# Patient Record
Sex: Female | Born: 2000 | Race: Black or African American | Hispanic: No | Marital: Single | Smoking: Never smoker
Health system: Southern US, Community
[De-identification: ages and names within clinical notes are randomized; demographics above are authoritative.]

## PROBLEM LIST (undated history)

## (undated) ENCOUNTER — Inpatient Hospital Stay: Payer: Self-pay

## (undated) DIAGNOSIS — Z8759 Personal history of other complications of pregnancy, childbirth and the puerperium: Secondary | ICD-10-CM

## (undated) DIAGNOSIS — D649 Anemia, unspecified: Secondary | ICD-10-CM

## (undated) DIAGNOSIS — Z8619 Personal history of other infectious and parasitic diseases: Secondary | ICD-10-CM

## (undated) DIAGNOSIS — Z87448 Personal history of other diseases of urinary system: Secondary | ICD-10-CM

## (undated) DIAGNOSIS — D219 Benign neoplasm of connective and other soft tissue, unspecified: Secondary | ICD-10-CM

## (undated) DIAGNOSIS — N189 Chronic kidney disease, unspecified: Secondary | ICD-10-CM

## (undated) HISTORY — DX: Benign neoplasm of connective and other soft tissue, unspecified: D21.9

## (undated) HISTORY — DX: Personal history of other diseases of urinary system: Z87.448

## (undated) HISTORY — PX: OTHER SURGICAL HISTORY: SHX169

## (undated) HISTORY — DX: Personal history of other infectious and parasitic diseases: Z86.19

## (undated) HISTORY — DX: Personal history of other complications of pregnancy, childbirth and the puerperium: Z87.59

## (undated) HISTORY — DX: Chronic kidney disease, unspecified: N18.9

---
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
## 2020-01-19 ENCOUNTER — Other Ambulatory Visit: Payer: Self-pay

## 2020-01-19 ENCOUNTER — Ambulatory Visit (LOCAL_COMMUNITY_HEALTH_CENTER): Payer: Self-pay

## 2020-01-19 VITALS — BP 112/69 | Ht 62.0 in | Wt 100.5 lb

## 2020-01-19 DIAGNOSIS — Z3201 Encounter for pregnancy test, result positive: Secondary | ICD-10-CM

## 2020-01-19 LAB — PREGNANCY, URINE: Preg Test, Ur: POSITIVE — AB

## 2020-01-19 MED ORDER — PRENATAL 27-0.8 MG PO TABS: 1.0000 | ORAL_TABLET | Freq: Every day | ORAL | 0 refills | Status: AC

## 2020-01-19 NOTE — Progress Notes (Signed)
UPT positive. Plans prenatal care at [facility]. LMP 09/27/2019. States she has delayed care til now because she thought mother would be upset. Reports today that her mother is supportive. Advised pt to establish prenatal care ASAP. Pt in agreement. Questions answered and reports understanding. To clerk for preadmit. Josie Saunders, RN

## 2020-01-26 ENCOUNTER — Emergency Department
Admission: EM | Admit: 2020-01-26 | Discharge: 2020-01-26 | Disposition: A | Discharge status: Home / Self Care | Payer: Self-pay | Attending: Emergency Medicine | Admitting: Emergency Medicine

## 2020-01-26 ENCOUNTER — Other Ambulatory Visit: Payer: Self-pay

## 2020-01-26 ENCOUNTER — Encounter: Payer: Self-pay | Admitting: Emergency Medicine

## 2020-01-26 DIAGNOSIS — R1030 Lower abdominal pain, unspecified: Secondary | ICD-10-CM | POA: Insufficient documentation

## 2020-01-26 DIAGNOSIS — Z5321 Procedure and treatment not carried out due to patient leaving prior to being seen by health care provider: Secondary | ICD-10-CM | POA: Insufficient documentation

## 2020-01-26 NOTE — ED Notes (Signed)
Pt called to revitalize with no response

## 2020-01-26 NOTE — ED Notes (Signed)
Called for vital reassessment x 3 with no response

## 2020-01-26 NOTE — ED Notes (Signed)
Pt called to be revitalized with no response

## 2020-01-26 NOTE — ED Triage Notes (Signed)
C/O lower abdominal pain since 1800.  Denies vaginal bleeding.  Denies dysuria.  G2 P1.   EDC: 07/03/2020.  Has not had prenatal care.  AAOx3.  Skin warm and dry no apparent distress

## 2020-01-31 NOTE — L&D Delivery Note (Signed)
Delivery Note  First Stage: Labor onset: 0800 Augmentation: cytotec x 2 Analgesia /Anesthesia intrapartum: epidural SROM at 0950  Second Stage: Complete dilation at 1053 Onset of pushing at 1056 FHR second stage Cat II- early and variables  Delivery of a viable female infant on 06/14/20 at 1110 by CNM delivery of fetal head in OA position with restitution to LOA. No nuchal cord;  Anterior then posterior shoulders delivered easily with gentle downward traction. Baby placed on mom's chest, and attended to by peds.  Cord double clamped after cessation of pulsation, cut by FOB  Third Stage: Placenta delivered spontaneously intact with 3VC @ 1115 Placenta disposition: to pathology, Velamentous insertion noted, approx 5cm from placenta.  Uterine tone Firm / bleeding scant  No cervical, vaginal or perineal lacerations identified  Repair n/a Est. Blood Loss (mL): 287  Complications: none  Mom to postpartum.  Baby to Couplet care / Skin to Skin.  Newborn: Birth Weight: 5#14  Apgar Scores: 8/10 Feeding planned: breast

## 2020-02-04 ENCOUNTER — Observation Stay
Admission: EM | Admit: 2020-02-04 | Discharge: 2020-02-06 | Disposition: A | Discharge status: Home / Self Care | Payer: HRSA Program | Attending: Family Medicine | Admitting: Family Medicine

## 2020-02-04 ENCOUNTER — Other Ambulatory Visit: Payer: Self-pay

## 2020-02-04 ENCOUNTER — Emergency Department: Payer: HRSA Program

## 2020-02-04 DIAGNOSIS — N1330 Unspecified hydronephrosis: Secondary | ICD-10-CM | POA: Insufficient documentation

## 2020-02-04 DIAGNOSIS — O98512 Other viral diseases complicating pregnancy, second trimester: Secondary | ICD-10-CM | POA: Diagnosis present

## 2020-02-04 DIAGNOSIS — N12 Tubulo-interstitial nephritis, not specified as acute or chronic: Secondary | ICD-10-CM

## 2020-02-04 DIAGNOSIS — O26832 Pregnancy related renal disease, second trimester: Secondary | ICD-10-CM | POA: Diagnosis not present

## 2020-02-04 DIAGNOSIS — Z3492 Encounter for supervision of normal pregnancy, unspecified, second trimester: Secondary | ICD-10-CM

## 2020-02-04 DIAGNOSIS — Z3490 Encounter for supervision of normal pregnancy, unspecified, unspecified trimester: Secondary | ICD-10-CM

## 2020-02-04 DIAGNOSIS — O2302 Infections of kidney in pregnancy, second trimester: Secondary | ICD-10-CM | POA: Diagnosis not present

## 2020-02-04 DIAGNOSIS — Z3689 Encounter for other specified antenatal screening: Secondary | ICD-10-CM

## 2020-02-04 DIAGNOSIS — Z3A18 18 weeks gestation of pregnancy: Secondary | ICD-10-CM | POA: Insufficient documentation

## 2020-02-04 DIAGNOSIS — U071 COVID-19: Secondary | ICD-10-CM | POA: Insufficient documentation

## 2020-02-04 DIAGNOSIS — N10 Acute tubulo-interstitial nephritis: Secondary | ICD-10-CM

## 2020-02-04 DIAGNOSIS — R1031 Right lower quadrant pain: Secondary | ICD-10-CM

## 2020-02-04 LAB — URINALYSIS, COMPLETE (UACMP) WITH MICROSCOPIC
Bilirubin Urine: NEGATIVE
Glucose, UA: NEGATIVE mg/dL
Ketones, ur: 80 mg/dL — AB
Nitrite: POSITIVE — AB
Protein, ur: 30 mg/dL — AB
Specific Gravity, Urine: 1.015 (ref 1.005–1.030)
WBC, UA: >50 WBC/hpf — ABNORMAL HIGH (ref 0–5)
pH: 5 (ref 5.0–8.0)

## 2020-02-04 LAB — COMPREHENSIVE METABOLIC PANEL
ALT: 9 U/L (ref 0–44)
AST: 13 U/L — ABNORMAL LOW (ref 15–41)
Albumin: 3.1 g/dL — ABNORMAL LOW (ref 3.5–5.0)
Alkaline Phosphatase: 47 U/L (ref 38–126)
Anion gap: 10 (ref 5–15)
BUN: 9 mg/dL (ref 6–20)
CO2: 27 mmol/L (ref 22–32)
Calcium: 8.7 mg/dL — ABNORMAL LOW (ref 8.9–10.3)
Chloride: 99 mmol/L (ref 98–111)
Creatinine, Ser: 0.58 mg/dL (ref 0.44–1.00)
GFR, Estimated: >60 mL/min (ref >60)
Glucose, Bld: 88 mg/dL (ref 70–99)
Potassium: 3.1 mmol/L — ABNORMAL LOW (ref 3.5–5.1)
Sodium: 136 mmol/L (ref 135–145)
Total Bilirubin: 0.6 mg/dL (ref 0.3–1.2)
Total Protein: 7.1 g/dL (ref 6.5–8.1)

## 2020-02-04 LAB — CBC
HCT: 34.3 % — ABNORMAL LOW (ref 36.0–46.0)
Hemoglobin: 11.6 g/dL — ABNORMAL LOW (ref 12.0–15.0)
MCH: 29.4 pg (ref 26.0–34.0)
MCHC: 33.8 g/dL (ref 30.0–36.0)
MCV: 87.1 fL (ref 80.0–100.0)
Platelets: 213 10*3/uL (ref 150–400)
RBC: 3.94 MIL/uL (ref 3.87–5.11)
RDW: 12.5 % (ref 11.5–15.5)
WBC: 9.2 10*3/uL (ref 4.0–10.5)
nRBC: 0 % (ref 0.0–0.2)

## 2020-02-04 LAB — LIPASE, BLOOD: Lipase: 20 U/L (ref 11–51)

## 2020-02-04 LAB — HCG, QUANTITATIVE, PREGNANCY: hCG, Beta Chain, Quant, S: 32835 m[IU]/mL — ABNORMAL HIGH (ref <5)

## 2020-02-04 MED ORDER — SODIUM CHLORIDE 0.9 % IV SOLN
1.0000 g | Freq: Once | INTRAVENOUS | Status: AC
  Administered 2020-02-05: 1 g via INTRAVENOUS
  Filled 2020-02-04: qty 10

## 2020-02-04 MED ORDER — SODIUM CHLORIDE 0.9 % IV BOLUS (SEPSIS)
1000.0000 mL | Freq: Once | INTRAVENOUS | Status: AC
  Administered 2020-02-05: 1000 mL via INTRAVENOUS

## 2020-02-04 NOTE — ED Notes (Signed)
Patient transported to Ultrasound 

## 2020-02-04 NOTE — ED Triage Notes (Signed)
Pt comes pov [redacted] weeks pregnant with abdominal pain for about a week

## 2020-02-04 NOTE — ED Provider Notes (Signed)
11:00 pm  Assumed care.  20 year old female here with right lower quadrant pain for several days.  Approximate 18 weeks by dates.  OB ultrasound pending.  May need MRI of the abdomen to rule out appendicitis.  Urine does appear infected.  She is nitrite positive with large leuks, rare bacteria.  White blood cell clumps present.  Culture pending.  Will give IV Rocephin.  She also has large ketones in her urine.  Will start give IV hydration.  2:00 AM  Pt's OB ultrasound shows single intrauterine pregnancy of 18 weeks 3 days without acute abnormality.  Renal ultrasound added on given patient's urinalysis and shows moderate right and mild left hydronephrosis with no visible stone.  She has no known history of kidney stones and appears well-appearing at this time.  Complains mostly of having right flank and right lower quadrant abdominal pain.  I suspect that pyelonephritis is the cause of her symptoms today.  Given she is pregnant, will discuss with medicine for admission.  She reports her previous pregnancy she was seen by the health department.  She is scheduled to see [facility] clinic in January but has not seen anyone yet for this pregnancy.  I do not feel this time that OB/GYN needs to be involved emergently given this is not technically a viable pregnancy.  2:48 AM Discussed patient's case with hospitalist, Dr. Para March.  I have recommended admission and patient (and family if present) agree with this plan. Admitting physician will place admission orders.   I reviewed all nursing notes, vitals, pertinent previous records and reviewed/interpreted all EKGs, lab and urine results, imaging (as available).     Sullivan Blasing, Layla Maw, DO 02/05/20 [phone]

## 2020-02-04 NOTE — ED Provider Notes (Signed)
[facility] Emergency Department Provider Note  ____________________________________________   I have reviewed the triage vital signs and the nursing notes.   HISTORY  Chief Complaint Abdominal Pain   History limited by: Not Limited   HPI Betty Allen is a 20 y.o. female who presents to the emergency department today because of concerns for right lower abdominal pain.  Patient thinks that she is roughly [redacted] weeks pregnant.  She states he just recently found out and has not yet had an ultrasound.  She states that the pain started roughly 1 week ago.  Initially was somewhat intermittent however it is more constant now.  She does describe it as severe.  She denies any unusual vaginal discharge or vaginal bleeding.  She denies any problems with urination or defecation.   Records reviewed. Per medical record review patient has a history of fibroids.   Past Medical History:  Diagnosis Date  . Anemia   . History of chlamydia   . History of gonorrhea   . History of prior pregnancy with IUGR newborn   . History of spontaneous abortion    x1    Patient Active Problem List   Diagnosis Date Noted  . 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 Preterm uterine contractions, antepartum, third trimester 10/30/2017  . resolved Fetal growth retardation, antepartum 10/08/2017    Past Surgical History:  Procedure Laterality Date  . denies surgical history      Prior to Admission medications   Medication Sig Start Date End Date Taking? Authorizing Provider  ferrous sulfate 325 (65 FE) MG tablet Take 1 tablet (325 mg total) by mouth 2 (two) times daily with a meal. Patient not taking: Reported on 01/08/2019 12/22/17   Sharee Pimple, CNM  ibuprofen (ADVIL) 800 MG tablet Take 1 tablet (800 mg total) by mouth every 8 (eight) hours as needed. Patient not taking: Reported on 01/08/2019 06/25/18   Emily Filbert, MD  Prenatal Vit-Fe Fumarate-FA (MULTIVITAMIN-PRENATAL)  27-0.8 MG TABS tablet Take 1 tablet by mouth daily at 12 noon. 01/19/20 04/28/20  Federico Flake, MD  Prenatal Vit-Fe Fumarate-FA (PRENATAL MULTIVITAMIN) TABS tablet Take 1 tablet by mouth daily at 12 noon.    [provider]  Prenatal Vit-Fe Fumarate-FA (PRENATAL VITAMIN) 27-0.8 MG TABS Take 1 tablet by mouth daily at 6 (six) AM. Patient not taking: Reported on 01/19/2020 01/08/19   Federico Flake, MD  sulfamethoxazole-trimethoprim (BACTRIM DS) 800-160 MG tablet Take 1 tablet by mouth 2 (two) times daily. Patient not taking: Reported on 01/08/2019 06/25/18   Emily Filbert, MD    Allergies Penicillins  Family History  Problem Relation Age of Onset  . Hypertension Father   . Hypertension Maternal Grandmother   . Hypertension Maternal Grandfather   . Hypertension Paternal Grandmother   . Hypertension Paternal Grandfather     Social History Social History   Tobacco Use  . Smoking status: Never Smoker  . Smokeless tobacco: Never Used  Vaping Use  . Vaping Use: Never used  Substance Use Topics  . Alcohol use: No  . Drug use: Not Currently    Types: Marijuana    Comment: Last use in summer 2020    Review of Systems Constitutional: No fever/chills Eyes: No visual changes. ENT: No sore throat. Cardiovascular: Denies chest pain. Respiratory: Denies shortness of breath. Gastrointestinal: Positive for lower abdominal pain. Genitourinary: Negative for dysuria. Musculoskeletal: Negative for back pain. Skin: Negative for rash. Neurological: Negative for headaches, focal weakness or numbness.  ____________________________________________   PHYSICAL EXAM:  VITAL SIGNS: ED Triage Vitals  Enc Vitals Group     BP 02/04/20 1803 107/72     Pulse Rate 02/04/20 1803 88     Resp 02/04/20 1803 18     Temp 02/04/20 1803 98.1 F (36.7 C)     Temp Source 02/04/20 1803 Oral     SpO2 02/04/20 1803 99 %     Weight 02/04/20 1804 105 lb (47.6 kg)     Height  02/04/20 1804 5\' 2"  (1.575 m)     Head Circumference --      Peak Flow --      Pain Score 02/04/20 1803 10   Constitutional: Alert and oriented.  Eyes: Conjunctivae are normal.  ENT      Head: Normocephalic and atraumatic.      Nose: No congestion/rhinnorhea.      Mouth/Throat: Mucous membranes are moist.      Neck: No stridor. Hematological/Lymphatic/Immunilogical: No cervical lymphadenopathy. Cardiovascular: Normal rate, regular rhythm.  No murmurs, rubs, or gallops.  Respiratory: Normal respiratory effort without tachypnea nor retractions. Breath sounds are clear and equal bilaterally. No wheezes/rales/rhonchi. Gastrointestinal: Soft, gravid. Tender to palpation in the right lower quadrant. Genitourinary: Deferred Musculoskeletal: Normal range of motion in all extremities. No lower extremity edema. Neurologic:  Normal speech and language. No gross focal neurologic deficits are appreciated.  Skin:  Skin is warm, dry and intact. No rash noted. Psychiatric: Mood and affect are normal. Speech and behavior are normal. Patient exhibits appropriate insight and judgment.  ____________________________________________    LABS (pertinent positives/negatives)  hcg 32,835 Lipase 20 CBC wbc 9.2, hgb 11.6, plt 213 CMP na 136, k 3.1, cr 0.58  ____________________________________________   EKG  None  ____________________________________________    RADIOLOGY  None   ____________________________________________   PROCEDURES  Procedures  ____________________________________________   INITIAL IMPRESSION / ASSESSMENT AND PLAN / ED COURSE  Pertinent labs & imaging results that were available during my care of the patient were reviewed by me and considered in my medical decision making (see chart for details).   Patient presents to the emergency department today because of concerns for right lower quadrant pain in the setting of early first trimester pregnancy.  The patient  has not had an ultrasound done at this time so exact dates are unknown.  She thinks she is roughly [redacted] weeks pregnant by last menstrual period.  On exam patient does have a slightly gravid stomach with tenderness to the right lower quadrant.  I do think first reasonable step would be obtaining an ultrasound.  If ultrasound is unrevealing would potentially consider MRI for appendicitis if patient continues to have pain.  ____________________________________________   FINAL CLINICAL IMPRESSION(S) / ED DIAGNOSES  Final diagnoses:  Right lower quadrant abdominal pain  Pregnancy, unspecified gestational age     Note: This dictation was prepared with Diplomatic Services operational officer dictation. Any transcriptional errors that result from this process are unintentional     Nance Pear, MD 02/04/20 2315

## 2020-02-05 ENCOUNTER — Observation Stay: Payer: HRSA Program

## 2020-02-05 ENCOUNTER — Emergency Department: Payer: HRSA Program

## 2020-02-05 DIAGNOSIS — N1330 Unspecified hydronephrosis: Secondary | ICD-10-CM

## 2020-02-05 DIAGNOSIS — O26832 Pregnancy related renal disease, second trimester: Secondary | ICD-10-CM | POA: Diagnosis not present

## 2020-02-05 DIAGNOSIS — Z3A18 18 weeks gestation of pregnancy: Secondary | ICD-10-CM | POA: Diagnosis not present

## 2020-02-05 DIAGNOSIS — O2302 Infections of kidney in pregnancy, second trimester: Secondary | ICD-10-CM | POA: Diagnosis not present

## 2020-02-05 DIAGNOSIS — N10 Acute tubulo-interstitial nephritis: Secondary | ICD-10-CM | POA: Diagnosis not present

## 2020-02-05 DIAGNOSIS — Z3492 Encounter for supervision of normal pregnancy, unspecified, second trimester: Secondary | ICD-10-CM

## 2020-02-05 LAB — SARS CORONAVIRUS 2 (TAT 6-24 HRS): SARS Coronavirus 2: POSITIVE — AB

## 2020-02-05 LAB — MAGNESIUM: Magnesium: 1.9 mg/dL (ref 1.7–2.4)

## 2020-02-05 LAB — HIV ANTIBODY (ROUTINE TESTING W REFLEX): HIV Screen 4th Generation wRfx: NONREACTIVE

## 2020-02-05 LAB — POC SARS CORONAVIRUS 2 AG -  ED: SARS Coronavirus 2 Ag: POSITIVE — AB

## 2020-02-05 MED ORDER — SODIUM CHLORIDE 0.9 % IV SOLN
INTRAVENOUS | Status: AC
  Filled 2020-02-05: qty 1

## 2020-02-05 MED ORDER — ONDANSETRON HCL 4 MG PO TABS: 4.0000 mg | ORAL_TABLET | Freq: Four times a day (QID) | ORAL | Status: DC | PRN

## 2020-02-05 MED ORDER — ACETAMINOPHEN 500 MG PO TABS
1000.0000 mg | ORAL_TABLET | Freq: Once | ORAL | Status: AC
  Administered 2020-02-05: 1000 mg via ORAL
  Filled 2020-02-05: qty 2

## 2020-02-05 MED ORDER — ONDANSETRON HCL 4 MG/2ML IJ SOLN: 4.0000 mg | Freq: Four times a day (QID) | INTRAMUSCULAR | Status: DC | PRN

## 2020-02-05 MED ORDER — POTASSIUM CHLORIDE CRYS ER 20 MEQ PO TBCR
40.0000 meq | EXTENDED_RELEASE_TABLET | Freq: Once | ORAL | Status: AC
  Administered 2020-02-05: 40 meq via ORAL
  Filled 2020-02-05: qty 2

## 2020-02-05 MED ORDER — SODIUM CHLORIDE 0.9 % IV SOLN: INTRAVENOUS | Status: DC

## 2020-02-05 MED ORDER — SODIUM CHLORIDE 0.9 % IV SOLN
1.0000 g | INTRAVENOUS | Status: DC
  Administered 2020-02-06: 1 g via INTRAVENOUS
  Filled 2020-02-05 (×2): qty 10

## 2020-02-05 MED ORDER — PRENATAL MULTIVITAMIN CH
1.0000 | ORAL_TABLET | Freq: Every day | ORAL | Status: DC
  Administered 2020-02-05 – 2020-02-06 (×2): 1 via ORAL
  Filled 2020-02-05 (×3): qty 1

## 2020-02-05 MED ORDER — ACETAMINOPHEN 325 MG PO TABS
650.0000 mg | ORAL_TABLET | Freq: Four times a day (QID) | ORAL | Status: DC | PRN
  Administered 2020-02-06: 650 mg via ORAL
  Filled 2020-02-05: qty 2

## 2020-02-05 MED ORDER — FERROUS SULFATE 325 (65 FE) MG PO TABS: 325.0000 mg | ORAL_TABLET | Freq: Two times a day (BID) | ORAL | Status: DC

## 2020-02-05 MED ORDER — ACETAMINOPHEN 650 MG RE SUPP: 650.0000 mg | Freq: Four times a day (QID) | RECTAL | Status: DC | PRN

## 2020-02-05 MED ORDER — HYDROCODONE-ACETAMINOPHEN 5-325 MG PO TABS
1.0000 | ORAL_TABLET | ORAL | Status: DC | PRN
  Administered 2020-02-05: 2 via ORAL
  Filled 2020-02-05: qty 2

## 2020-02-05 NOTE — Consult Note (Signed)
Consult History and Physical   SERVICE: Obstetrics   Patient Name: Betty Allen Patient MRN:   [id number]  CC: abdominal and flank pain   HPI: Betty Allen is a 20 y.o. [phone] with no established prenatal care who presented to the emergency room with right flank pain.  She states that the pain has been getting worse over the past couple of weeks but became more constant and severe yesterday.  She denies vaginal bleeding, LOF, or cramping.  She denies cough, SOB, or chest pain.    Review of Systems: positives in bold Review of Systems  Constitutional: Positive for chills and malaise/fatigue.  Respiratory: Negative for cough and shortness of breath.   Cardiovascular: Negative for chest pain.  Gastrointestinal: Positive for abdominal pain. Negative for nausea.  Genitourinary: Positive for dysuria and flank pain.  Neurological: Negative for dizziness.  Psychiatric/Behavioral: The patient is not nervous/anxious.     Past Obstetrical History: OB History    Gravida  4   Para  1   Term  1   Preterm  0   AB  2   Living  1     SAB  2   IAB  0   Ectopic  0   Multiple  0   Live Births  1           Past Medical History: Past Medical History:  Diagnosis Date  . Anemia   . History of chlamydia   . History of gonorrhea   . History of prior pregnancy with IUGR newborn   . History of spontaneous abortion    x1    Past Surgical History:   Past Surgical History:  Procedure Laterality Date  . denies surgical history      Family History:  family history includes Hypertension in her father, maternal grandfather, maternal grandmother, paternal grandfather, and paternal grandmother.  Social History:   reports that she has never smoked. She has never used smokeless tobacco. She reports previous drug use. Drug: Marijuana. She reports that she does not drink alcohol.   Home Medications:  Medications reconciled in EPIC  No current  facility-administered medications on file prior to encounter.   Current Outpatient Medications on File Prior to Encounter  Medication Sig Dispense Refill  . Prenatal Vit-Fe Fumarate-FA (MULTIVITAMIN-PRENATAL) 27-0.8 MG TABS tablet Take 1 tablet by mouth daily at 12 noon. 100 tablet 0  . ferrous sulfate 325 (65 FE) MG tablet Take 1 tablet (325 mg total) by mouth 2 (two) times daily with a meal. (Patient not taking: No sig reported) 90 tablet 1  . ibuprofen (ADVIL) 800 MG tablet Take 1 tablet (800 mg total) by mouth every 8 (eight) hours as needed. (Patient not taking: No sig reported) 30 tablet 0  . Prenatal Vit-Fe Fumarate-FA (PRENATAL MULTIVITAMIN) TABS tablet Take 1 tablet by mouth daily at 12 noon. (Patient not taking: Reported on 02/05/2020)    . Prenatal Vit-Fe Fumarate-FA (PRENATAL VITAMIN) 27-0.8 MG TABS Take 1 tablet by mouth daily at 6 (six) AM. (Patient not taking: No sig reported) 100 tablet 0  . sulfamethoxazole-trimethoprim (BACTRIM DS) 800-160 MG tablet Take 1 tablet by mouth 2 (two) times daily. (Patient not taking: No sig reported) 6 tablet 0    Allergies:  Allergies  Allergen Reactions  . Penicillins Rash    Has patient had a PCN reaction causing immediate rash, facial/tongue/throat swelling, SOB or lightheadedness with hypotension: Yes Has patient had a PCN reaction causing severe rash involving mucus membranes  or skin necrosis: No Has patient had a PCN reaction that required hospitalization No Has patient had a PCN reaction occurring within the last 10 years: No If all of the above answers are "NO", then may proceed with Cephalosporin use.     Physical Exam:  Temp:  [98.1 F (36.7 C)] 98.1 F (36.7 C) (01/05 1803) Pulse Rate:  [80-108] 80 (01/06 0730) Resp:  [16-18] 16 (01/06 0730) BP: (94-108)/(57-72) 94/57 (01/06 0730) SpO2:  [99 %-100 %] 99 % (01/06 0730) Weight:  [47.6 kg] 47.6 kg (01/05 1804)   General Appearance:  Well developed, well nourished, no acute  distress, alert and oriented, cooperative and appears stated age HEENT:  Normocephalic atraumatic, extraocular movements intact, moist mucous membranes, neck supple with midline trachea and thyroid without masses Cardiovascular:  Normal S1/S2, regular rate and rhythm, no murmurs, 2+ distal pulses Pulmonary:  clear to auscultation, no wheezes, rales or rhonchi, symmetric air entry, good air exchange Abdomen:  Gravid, bowel sounds present, soft, nontender, nondistended, no abnormal masses or organomegaly, no epigastric pain, + CVA tenderness on right side Back: inspection of back is normal Extremities:  extremities normal, no tenderness, atraumatic, no cyanosis or edema Skin:  Warm to touch, normal coloration and turgor, no rashes, no suspicious skin lesions noted  Neurologic:  Cranial nerves 2-12 grossly intact, grossly equal strength and muscle tone, normal speech, no focal findings or movement disorder noted. Psychiatric:  Normal mood and affect, appropriate, no AH/VH Pelvic:  Deferred  Uterus: enlarged, c/w [redacted] week gestation   Labs/Studies:   CBC and Coags:  Lab Results  Component Value Date   WBC 9.2 02/04/2020   NEUTOPHILPCT 70 11/16/2015   EOSPCT 2 11/16/2015   BASOPCT 0 11/16/2015   LYMPHOPCT 21 11/16/2015   HGB 11.6 (L) 02/04/2020   HCT 34.3 (L) 02/04/2020   MCV 87.1 02/04/2020   PLT 213 02/04/2020   CMP:  Lab Results  Component Value Date   NA 136 02/04/2020   K 3.1 (L) 02/04/2020   CL 99 02/04/2020   CO2 27 02/04/2020   BUN 9 02/04/2020   CREATININE 0.58 02/04/2020   CREATININE 0.55 02/10/2019   CREATININE 0.68 06/25/2018   PROT 7.1 02/04/2020   BILITOT 0.6 02/04/2020   ALT 9 02/04/2020   AST 13 (L) 02/04/2020   ALKPHOS 47 02/04/2020   Other Imaging: US OB Limited  Result Date: 02/05/2020 CLINICAL DATA:  Right lower quadrant abdominal pain. Pregnant, LMP 09/27/2019 EXAM: LIMITED OBSTETRIC ULTRASOUND FINDINGS: Number of Fetuses: 1 Heart Rate:  138 bpm Movement:  Yes Presentation: Cephalic Placental Location: Anterior Previa: No Amniotic Fluid (Subjective):  Within normal limits. AFI:   Not calculated Femur length: 2.7 cm 18 w  3 d MATERNAL FINDINGS: Cervix:  Appears closed. Uterus/Adnexae: The maternal right ovary is unremarkable. No free fluid within the cul-de-sac. The maternal left ovary is not visualized. No adnexal masses. IMPRESSION: Single living intrauterine gestation with an estimated gestational age of [redacted] weeks, 3 days. No acute abnormality. This exam is performed on an emergent basis and does not comprehensively evaluate fetal size, dating, or anatomy; follow-up complete OB US should be considered if further fetal assessment is warranted. Electronically Signed   By: Helyn Numbers MD   On: 02/05/2020 01:05   US Renal  Result Date: 02/05/2020 CLINICAL DATA:  Initial evaluation for acute right flank pain. UTI, pregnancy. EXAM: RENAL / URINARY TRACT ULTRASOUND COMPLETE COMPARISON:  Prior CT from 06/25/2018. FINDINGS: Right Kidney: Renal measurements: To 13.2  x 6.2 x 5.4 cm = volume: 227.8 mL. Increased echogenicity within the renal parenchyma. No visible nephrolithiasis. Moderate hydronephrosis and proximal hydroureter. No focal renal mass. Left Kidney: Renal measurements: 11.9 x 4.6 x 4.2 cm = volume: 119.5 mL. Increased echogenicity within the renal parenchyma. No visible nephrolithiasis. Mild hydronephrosis. No focal renal mass. Bladder: Appears normal for degree of bladder distention. Echogenic material within the bladder could reflect debris and/or sedimentation. Ureteral jets not visualized. Other: Gravid uterus partially visualized. IMPRESSION: 1. Moderate right and mild left hydronephrosis. No visible nephrolithiasis by sonography. 2. Increased echogenicity within the renal parenchyma, suggesting medical renal disease. 3. Echogenic material within the bladder, which could reflect debris and/or sedimentation. Correlation with urinalysis recommended.  Electronically Signed   By: Jeannine Boga M.D.   On: 02/05/2020 01:55     Assessment / Plan:   PORTER PLINER is a 20 y.o. [id number] who presents with UTI, suspect acute pyelonephritis   1.  Mekhia has not yet established care for this pregnancy but has an appointment later this month.  Exam today is consistent with ~[redacted] week gestation.   2. IV antibiotics have been initiated for likely acute pyelonephritis.  She will need at least 24-48 hours of IV antibiotics until symptomatically improving and fever free for 24 hours.   3. Plan for FHT q shift  4. Incidental covid + on admission, asymptomatic.  She received both doses of Moderna Covid vaccine, with her 2nd dose being last month.     Thank you for the opportunity to be involved with this patient's care.  ----- Drinda Butts, CNM Midwife [facility], Department of [facility]

## 2020-02-05 NOTE — H&P (Signed)
History and Physical    Betty Allen [id number] DOB: [birth year] DOA: 02/04/2020  PCP: Patient, No Pcp Per   Patient coming from: Home  I have personally briefly reviewed patient's old medical records in [place]  Chief Complaint: Right flank pain  HPI: Betty Allen is a 20 y.o. female who is [redacted] weeks pregnant with no prenatal care until yesterday who presented to the emergency room with right flank pain, intermittent over the past week but now more constant.  Pain is moderate to severe, colicky, nonradiating, no aggravating or alleviating factors.  She denies vaginal bleeding or discharge or leaking.  She has no associated nausea, vomiting, diarrhea, dysuria, fever or chills.  She denies cough or shortness of breath and denies chest pain  ED Course: On arrival in the emergency room, vitals were within normal limits.  Blood work normal except for mild hypokalemia of 3.1.  Lipase normal at 28.  LFTs normal.  Urinalysis with pyuria, ketones and protein. Imaging: OB ultrasound shows single viable fetus with heart rate 138.  Renal ultrasound showed moderate right and mild left hydronephrosis but no visible nephrolithiasis.  Increased echogenicity of the renal parenchyma suggesting medical renal disease.  Echogenic material within the bladder.  Patient started on Rocephin.  Hospitalist consulted for admission.  Review of Systems: As per HPI otherwise all other systems on review of systems negative.    Past Medical History:  Diagnosis Date  . Anemia   . History of chlamydia   . History of gonorrhea   . History of prior pregnancy with IUGR newborn   . History of spontaneous abortion    x1    Past Surgical History:  Procedure Laterality Date  . denies surgical history       reports that she has never smoked. She has never used smokeless tobacco. She reports previous drug use. Drug: Marijuana. She reports that she does not drink alcohol.  Allergies  Allergen  Reactions  . Penicillins Rash    Has patient had a PCN reaction causing immediate rash, facial/tongue/throat swelling, SOB or lightheadedness with hypotension: Yes Has patient had a PCN reaction causing severe rash involving mucus membranes or skin necrosis: No Has patient had a PCN reaction that required hospitalization No Has patient had a PCN reaction occurring within the last 10 years: No If all of the above answers are "NO", then may proceed with Cephalosporin use.     Family History  Problem Relation Age of Onset  . Hypertension Father   . Hypertension Maternal Grandmother   . Hypertension Maternal Grandfather   . Hypertension Paternal Grandmother   . Hypertension Paternal Grandfather       Prior to Admission medications   Medication Sig Start Date End Date Taking? Authorizing Provider  Prenatal Vit-Fe Fumarate-FA (MULTIVITAMIN-PRENATAL) 27-0.8 MG TABS tablet Take 1 tablet by mouth daily at 12 noon. 01/19/20 04/28/20 Yes Caren Macadam, MD  ferrous sulfate 325 (65 FE) MG tablet Take 1 tablet (325 mg total) by mouth 2 (two) times daily with a meal. Patient not taking: No sig reported 12/22/17   Catheryn Bacon, CNM  ibuprofen (ADVIL) 800 MG tablet Take 1 tablet (800 mg total) by mouth every 8 (eight) hours as needed. Patient not taking: No sig reported 06/25/18   Earleen Newport, MD  Prenatal Vit-Fe Fumarate-FA (PRENATAL MULTIVITAMIN) TABS tablet Take 1 tablet by mouth daily at 12 noon. Patient not taking: Reported on 02/05/2020    [provider]  Prenatal Vit-Fe Fumarate-FA (PRENATAL VITAMIN) 27-0.8 MG TABS Take 1 tablet by mouth daily at 6 (six) AM. Patient not taking: No sig reported 01/08/19   Federico Flake, MD  sulfamethoxazole-trimethoprim (BACTRIM DS) 800-160 MG tablet Take 1 tablet by mouth 2 (two) times daily. Patient not taking: No sig reported 06/25/18   Emily Filbert, MD    Physical Exam: Vitals:   02/04/20 1803 02/04/20 1804  02/05/20 0233  BP: 107/72  108/70  Pulse: 88  (!) 108  Resp: 18  16  Temp: 98.1 F (36.7 C)    TempSrc: Oral    SpO2: 99%  100%  Weight:  47.6 kg   Height:  5\' 2"  (1.575 m)      Vitals:   02/04/20 1803 02/04/20 1804 02/05/20 0233  BP: 107/72  108/70  Pulse: 88  (!) 108  Resp: 18  16  Temp: 98.1 F (36.7 C)    TempSrc: Oral    SpO2: 99%  100%  Weight:  47.6 kg   Height:  5\' 2"  (1.575 m)       Constitutional: Alert and oriented x 3 . Not in any apparent distress HEENT:      Head: Normocephalic and atraumatic.         Eyes: PERLA, EOMI, Conjunctivae are normal. Sclera is non-icteric.       Mouth/Throat: Mucous membranes are moist.       Neck: Supple with no signs of meningismus. Cardiovascular: Regular rate and rhythm. No murmurs, gallops, or rubs. 2+ symmetrical distal pulses are present . No JVD. No LE edema Respiratory: Respiratory effort normal .Lungs sounds clear bilaterally. No wheezes, crackles, or rhonchi.  Gastrointestinal: Soft, tender right flank, and non distended with positive bowel sounds.  Genitourinary:  Right CVA tenderness. Musculoskeletal: Nontender with normal range of motion in all extremities. No cyanosis, or erythema of extremities. Neurologic:  Face is symmetric. Moving all extremities. No gross focal neurologic deficits . Skin: Skin is warm, dry.  No rash or ulcers Psychiatric: Mood and affect are normal    Labs on Admission: I have personally reviewed following labs and imaging studies  CBC: Recent Labs  Lab 02/04/20 1805  WBC 9.2  HGB 11.6*  HCT 34.3*  MCV 87.1  PLT 213   Basic Metabolic Panel: Recent Labs  Lab 02/04/20 1805  NA 136  K 3.1*  CL 99  CO2 27  GLUCOSE 88  BUN 9  CREATININE 0.58  CALCIUM 8.7*   GFR: Estimated Creatinine Clearance: 85 mL/min (by C-G formula based on SCr of 0.58 mg/dL). Liver Function Tests: Recent Labs  Lab 02/04/20 1805  AST 13*  ALT 9  ALKPHOS 47  BILITOT 0.6  PROT 7.1  ALBUMIN 3.1*    Recent Labs  Lab 02/04/20 1805  LIPASE 20   No results for input(s): AMMONIA in the last 168 hours. Coagulation Profile: No results for input(s): INR, PROTIME in the last 168 hours. Cardiac Enzymes: No results for input(s): CKTOTAL, CKMB, CKMBINDEX, TROPONINI in the last 168 hours. BNP (last 3 results) No results for input(s): PROBNP in the last 8760 hours. HbA1C: No results for input(s): HGBA1C in the last 72 hours. CBG: No results for input(s): GLUCAP in the last 168 hours. Lipid Profile: No results for input(s): CHOL, HDL, LDLCALC, TRIG, CHOLHDL, LDLDIRECT in the last 72 hours. Thyroid Function Tests: No results for input(s): TSH, T4TOTAL, FREET4, T3FREE, THYROIDAB in the last 72 hours. Anemia Panel: No results for input(s): VITAMINB12, FOLATE, FERRITIN,  TIBC, IRON, RETICCTPCT in the last 72 hours. Urine analysis:    Component Value Date/Time   COLORURINE YELLOW (A) 02/04/2020 2315   APPEARANCEUR CLOUDY (A) 02/04/2020 2315   APPEARANCEUR Clear 07/08/2013 1225   LABSPEC 1.015 02/04/2020 2315   LABSPEC 1.018 07/08/2013 1225   PHURINE 5.0 02/04/2020 2315   GLUCOSEU NEGATIVE 02/04/2020 2315   GLUCOSEU Negative 07/08/2013 1225   HGBUR SMALL (A) 02/04/2020 2315   BILIRUBINUR NEGATIVE 02/04/2020 2315   BILIRUBINUR Negative 07/08/2013 1225   KETONESUR 80 (A) 02/04/2020 2315   PROTEINUR 30 (A) 02/04/2020 2315   NITRITE POSITIVE (A) 02/04/2020 2315   LEUKOCYTESUR LARGE (A) 02/04/2020 2315   LEUKOCYTESUR Negative 07/08/2013 1225    Radiological Exams on Admission: US OB Limited  Result Date: 02/05/2020 CLINICAL DATA:  Right lower quadrant abdominal pain. Pregnant, LMP 09/27/2019 EXAM: LIMITED OBSTETRIC ULTRASOUND FINDINGS: Number of Fetuses: 1 Heart Rate:  138 bpm Movement: Yes Presentation: Cephalic Placental Location: Anterior Previa: No Amniotic Fluid (Subjective):  Within normal limits. AFI:   Not calculated Femur length: 2.7 cm 18 w  3 d MATERNAL FINDINGS: Cervix:   Appears closed. Uterus/Adnexae: The maternal right ovary is unremarkable. No free fluid within the cul-de-sac. The maternal left ovary is not visualized. No adnexal masses. IMPRESSION: Single living intrauterine gestation with an estimated gestational age of [redacted] weeks, 3 days. No acute abnormality. This exam is performed on an emergent basis and does not comprehensively evaluate fetal size, dating, or anatomy; follow-up complete OB US should be considered if further fetal assessment is warranted. Electronically Signed   By: Fidela Salisbury MD   On: 02/05/2020 01:05   US Renal  Result Date: 02/05/2020 CLINICAL DATA:  Initial evaluation for acute right flank pain. UTI, pregnancy. EXAM: RENAL / URINARY TRACT ULTRASOUND COMPLETE COMPARISON:  Prior CT from 06/25/2018. FINDINGS: Right Kidney: Renal measurements: To 13.2 x 6.2 x 5.4 cm = volume: 227.8 mL. Increased echogenicity within the renal parenchyma. No visible nephrolithiasis. Moderate hydronephrosis and proximal hydroureter. No focal renal mass. Left Kidney: Renal measurements: 11.9 x 4.6 x 4.2 cm = volume: 119.5 mL. Increased echogenicity within the renal parenchyma. No visible nephrolithiasis. Mild hydronephrosis. No focal renal mass. Bladder: Appears normal for degree of bladder distention. Echogenic material within the bladder could reflect debris and/or sedimentation. Ureteral jets not visualized. Other: Gravid uterus partially visualized. IMPRESSION: 1. Moderate right and mild left hydronephrosis. No visible nephrolithiasis by sonography. 2. Increased echogenicity within the renal parenchyma, suggesting medical renal disease. 3. Echogenic material within the bladder, which could reflect debris and/or sedimentation. Correlation with urinalysis recommended. Electronically Signed   By: Jeannine Boga M.D.   On: 02/05/2020 01:55     Assessment/Plan 20 year old female, [redacted] weeks pregnant with no prenatal care until 1/5 who presented with right flank  pain  UTI, suspect acute pyelonephritis Bilateral hydronephrosis without nephrolithiasis - Patient with right flank pain and work-up consistent with UTI - Bilateral hydronephrosis with increased echogenicity of kidneys suggesting medical renal disease - Patient received a dose of Rocephin in the emergency room - Severe allergy to penicillin listed.  Will defer further antibiotic choice pending OB consult.  Rocephin dose was at 2345 on 1/5 - Rocephin can be resumed if patient has no adverse reaction to dose in the emergency room - Consider urology consult in the a.m. for hydronephrosis    Second trimester pregnancy - Consider OB consult in the a.m.    DVT prophylaxis: SCDs Code Status: full code  Family Communication:  none  Disposition Plan: Back to previous home environment Consults called: none  Status: Observation    Athena Masse MD [facility]     02/05/2020, 2:58 AM

## 2020-02-05 NOTE — ED Notes (Signed)
Pt requesting tylenol for pain, MD made aware.

## 2020-02-05 NOTE — ED Notes (Signed)
Pt returned from US

## 2020-02-05 NOTE — ED Notes (Signed)
Pt resting in bed with visitor at bedside. Lights dimmed and call bell in reach. NAD noted at this time.

## 2020-02-05 NOTE — Consult Note (Signed)
Urology Consult  I have been asked to see the patient by Dr. Maryfrances Bunnell, for evaluation and management of bilateral hydronephrosis, UTI, and right flank pain.  Chief Complaint: Right flank pain  History of Present Illness: Betty Allen is a 20 y.o. year old pregnant female at [redacted]w[redacted]d by imaging who presented to the ED yesterday with reports of right flank pain.  Admission labs notable for Covid positive; creatinine 0.58, consistent with baseline; WBC count 9.2; and UA with nitrites, 6-10 RBCs/hpf, >50 WBCs/hpf, 6-10 squamous epithelial cells/LPF, and WBC clumps.  Urine culture pending, on antibiotics as below.  She underwent renal ultrasound today with findings of moderate right and mild left hydronephrosis without visible nephrolithiasis.  There is echogenic material within the bladder consistent with debris versus sediment.  Today, patient reports an approximate 2-week history of right flank pain radiating to the RLQ.  She states her symptoms acutely worsened yesterday, prompting her to seek care.  No known history of nephrolithiasis.  No left-sided pain.  Anti-infectives (From admission, onward)   Start     Dose/Rate Route Frequency Ordered Stop   02/05/20 0141  sodium chloride 0.9 % with ceFEPIme (MAXIPIME) ADS Med       Note to Pharmacy: Tory Emerald   : cabinet override      02/05/20 0141 02/05/20 1344   02/04/20 2345  cefTRIAXone (ROCEPHIN) 1 g in sodium chloride 0.9 % 100 mL IVPB        1 g 200 mL/hr over 30 Minutes Intravenous  Once 02/04/20 2336 02/05/20 7062       Past Medical History:  Diagnosis Date  . Anemia   . History of chlamydia   . History of gonorrhea   . History of prior pregnancy with IUGR newborn   . History of spontaneous abortion    x1    Past Surgical History:  Procedure Laterality Date  . denies surgical history      Home Medications:  Current Meds  Medication Sig  . Prenatal Vit-Fe Fumarate-FA (MULTIVITAMIN-PRENATAL) 27-0.8 MG TABS  tablet Take 1 tablet by mouth daily at 12 noon.    Allergies:  Allergies  Allergen Reactions  . Penicillins Rash    Has patient had a PCN reaction causing immediate rash, facial/tongue/throat swelling, SOB or lightheadedness with hypotension: Yes Has patient had a PCN reaction causing severe rash involving mucus membranes or skin necrosis: No Has patient had a PCN reaction that required hospitalization No Has patient had a PCN reaction occurring within the last 10 years: No If all of the above answers are "NO", then may proceed with Cephalosporin use.     Family History  Problem Relation Age of Onset  . Hypertension Father   . Hypertension Maternal Grandmother   . Hypertension Maternal Grandfather   . Hypertension Paternal Grandmother   . Hypertension Paternal Grandfather     Social History:  reports that she has never smoked. She has never used smokeless tobacco. She reports previous drug use. Drug: Marijuana. She reports that she does not drink alcohol.  ROS: A complete review of systems was performed.  All systems are negative except for pertinent findings as noted.  Physical Exam:  Vital signs in last 24 hours: Temp:  [98.1 F (36.7 C)] 98.1 F (36.7 C) (01/05 1803) Pulse Rate:  [80-108] 80 (01/06 0730) Resp:  [16-18] 16 (01/06 0730) BP: (94-108)/(57-72) 94/57 (01/06 0730) SpO2:  [99 %-100 %] 99 % (01/06 0730) Weight:  [47.6 kg] 47.6 kg (  01/05 1804) Constitutional:  Alert and oriented, no acute distress HEENT: [place] AT, moist mucus membranes Cardiovascular: No clubbing, cyanosis, or edema Respiratory: Normal respiratory effort Skin: No rashes, bruises or suspicious lesions Neurologic: Grossly intact, no focal deficits, moving all 4 extremities Psychiatric: Normal mood and affect  Laboratory Data:  Recent Labs    02/04/20 1805  WBC 9.2  HGB 11.6*  HCT 34.3*   Recent Labs    02/04/20 1805  NA 136  K 3.1*  CL 99  CO2 27  GLUCOSE 88  BUN 9  CREATININE 0.58   CALCIUM 8.7*   Urinalysis    Component Value Date/Time   COLORURINE YELLOW (A) 02/04/2020 2315   APPEARANCEUR CLOUDY (A) 02/04/2020 2315   APPEARANCEUR Clear 07/08/2013 1225   LABSPEC 1.015 02/04/2020 2315   LABSPEC 1.018 07/08/2013 1225   PHURINE 5.0 02/04/2020 2315   GLUCOSEU NEGATIVE 02/04/2020 2315   GLUCOSEU Negative 07/08/2013 1225   HGBUR SMALL (A) 02/04/2020 2315   BILIRUBINUR NEGATIVE 02/04/2020 2315   BILIRUBINUR Negative 07/08/2013 1225   KETONESUR 80 (A) 02/04/2020 2315   PROTEINUR 30 (A) 02/04/2020 2315   NITRITE POSITIVE (A) 02/04/2020 2315   LEUKOCYTESUR LARGE (A) 02/04/2020 2315   LEUKOCYTESUR Negative 07/08/2013 1225   Results for orders placed or performed during the hospital encounter of 10/30/17  Chlamydia/NGC rt PCR ([facility] only)     Status: Abnormal   Collection Time: 10/30/17  5:22 PM   Specimen: Cervical/Vaginal swab; Genital  Result Value Ref Range Status   Specimen source GC/Chlam ENDOCERVICAL  Final   Chlamydia Tr DETECTED (A) NOT DETECTED Final   N gonorrhoeae NOT DETECTED NOT DETECTED Final    Comment: (NOTE) This CT/NG assay has not been evaluated in patients with a history of  hysterectomy. Performed at [facility], [address] [place], [place] [address]   Wet prep, genital     Status: Abnormal   Collection Time: 10/30/17  5:22 PM   Specimen: Cervical/Vaginal swab  Result Value Ref Range Status   Yeast Wet Prep HPF POC NONE SEEN NONE SEEN Final   Trich, Wet Prep NONE SEEN NONE SEEN Final   Clue Cells Wet Prep HPF POC NONE SEEN NONE SEEN Final   WBC, Wet Prep HPF POC MODERATE (A) NONE SEEN Final   Sperm NONE SEEN  Final    Comment: Performed at [facility], [address] [place], [place] [address]    Radiologic Imaging: US Renal  Result Date: 02/05/2020 CLINICAL DATA:  Initial evaluation for acute right flank pain. UTI, pregnancy. EXAM: RENAL / URINARY TRACT ULTRASOUND COMPLETE COMPARISON:  Prior CT from  06/25/2018. FINDINGS: Right Kidney: Renal measurements: To 13.2 x 6.2 x 5.4 cm = volume: 227.8 mL. Increased echogenicity within the renal parenchyma. No visible nephrolithiasis. Moderate hydronephrosis and proximal hydroureter. No focal renal mass. Left Kidney: Renal measurements: 11.9 x 4.6 x 4.2 cm = volume: 119.5 mL. Increased echogenicity within the renal parenchyma. No visible nephrolithiasis. Mild hydronephrosis. No focal renal mass. Bladder: Appears normal for degree of bladder distention. Echogenic material within the bladder could reflect debris and/or sedimentation. Ureteral jets not visualized. Other: Gravid uterus partially visualized. IMPRESSION: 1. Moderate right and mild left hydronephrosis. No visible nephrolithiasis by sonography. 2. Increased echogenicity within the renal parenchyma, suggesting medical renal disease. 3. Echogenic material within the bladder, which could reflect debris and/or sedimentation. Correlation with urinalysis recommended. Electronically Signed   By: Rise Mu M.D.   On: 02/05/2020 01:55  Assessment & Plan:  20 year old Covid positive pregnant female at 66 weeks 3 days gestation presents with a 2-week history of right flank pain, acutely worsening yesterday.  Bilateral hydronephrosis and positive UA on admission.  Differential at this time includes complicated UTI/pyelonephritis with physiologic hydronephrosis of pregnancy versus acute stone episode.  Recommend low-dose CT to evaluate her for nephrolithiasis.  If positive, she will require urgent ureteral stenting today in the setting of infection.  If no stone seen on CT, she will require antibiotic therapy alone.  I had a lengthy conversation at the bedside with the patient today.  I explained that further imaging is necessary to confirm a stone prior to proceeding with stent placement, as the risks of surgery in a pregnant, Covid positive patient include increased mortality and preterm delivery.  We  discussed that low-dose CT carries a slightly increased risk of childhood cancers for the fetus.  No IV contrast necessary.  Patient expressed understanding of these risks and is willing to proceed, consent form signed.  Recommendations: -Low-dose noncontrast CT AP today -Continue empiric antibiotics and follow urine cultures -Possible right ureteral stent placement today per CT results  Thank you for involving me in this patient's care, I will continue to follow along.  Debroah Loop, PA-C 02/05/2020 10:23 AM

## 2020-02-05 NOTE — Progress Notes (Signed)
PROGRESS NOTE    Betty Allen  [id number] DOB: 19-Dec-2000 DOA: 02/04/2020 PCP: Patient, No Pcp Per      Brief Narrative:  Betty Allen is a 20 y.o. F G4P1021 at 18 weeks, hx IUGR newborn, hx chlamydia/GC who presented with abdominal pain progressive over several days.  Lower abdominal pain, colicky, without fever, chills.  No vaginal bleeding or leaking.  No vomiting, nausea, no cough, URI symptoms.    In the ER, WBC noted in urine.  US renal obtained that showed moderate hydronephrosis.  Started on ceftriaxone and admitted       Assessment & Plan:  Pyelonephritis with bilateral hydronephrosis Sepsis ruled out.  Tachycardic and with pyuria.  Hydro noted on [place] but no stones. -Continue ceftriaxone -Consult Urology, appreciate cares    Hypokalemia -Supplement K -Check mag  Pregnancy -Continue prenatal vitamins -Consult OB, appreciate expert cares  COVID-19 Incidental finding.       Disposition: Status is: Observation  The patient remains OBS appropriate and will d/c before 2 midnights.  Dispo: The patient is from: Home              Anticipated d/c is to: Home              Anticipated d/c date is: 1 day              Patient currently is not medically stable to d/c.              MDM: This is a no charge note.  For further details, please see H&P by my partner Dr. Damita Dunnings from earlier today.  The below labs and imaging reports were reviewed and summarized above.    DVT prophylaxis: SCDs Start: 02/05/20 0258  Code Status: FULL Family Communication: Partner at bedside    Consultants:   OB  Urology   Procedures:   1/5 OB US - 18 week pregnancy  1/6 US renal - hydro, no stones  1/6 CT renal study -- hydro, no stones  Antimicrobials:   Rocephin 1/5 >>    Culture data:   1/5 urine culture -- pending           Subjective: Patient feeling well.  Abdominal pain present, but mild.  No fever, confusion,  vomiting.        Objective: Vitals:   02/04/20 1804 02/05/20 0233 02/05/20 0730 02/05/20 1231  BP:  108/70 (!) 94/57 116/72  Pulse:  (!) 108 80 77  Resp:  16 16 16   Temp:    98 F (36.7 C)  TempSrc:    Oral  SpO2:  100% 99%   Weight: 47.6 kg     Height: 5\' 2"  (1.575 m)       Intake/Output Summary (Last 24 hours) at 02/05/2020 1344 Last data filed at 02/05/2020 0221 Gross per 24 hour  Intake 1098.4 ml  Output --  Net 1098.4 ml   Filed Weights   02/04/20 1804  Weight: 47.6 kg    Examination: The patient was seen and examined.      Data Reviewed: I have personally reviewed following labs and imaging studies:  CBC: Recent Labs  Lab 02/04/20 1805  WBC 9.2  HGB 11.6*  HCT 34.3*  MCV 87.1  PLT 123456   Basic Metabolic Panel: Recent Labs  Lab 02/04/20 1805  NA 136  K 3.1*  CL 99  CO2 27  GLUCOSE 88  BUN 9  CREATININE 0.58  CALCIUM 8.7*   GFR: Estimated Creatinine  Clearance: 85 mL/min (by C-G formula based on SCr of 0.58 mg/dL). Liver Function Tests: Recent Labs  Lab 02/04/20 1805  AST 13*  ALT 9  ALKPHOS 47  BILITOT 0.6  PROT 7.1  ALBUMIN 3.1*   Recent Labs  Lab 02/04/20 1805  LIPASE 20   No results for input(s): AMMONIA in the last 168 hours. Coagulation Profile: No results for input(s): INR, PROTIME in the last 168 hours. Cardiac Enzymes: No results for input(s): CKTOTAL, CKMB, CKMBINDEX, TROPONINI in the last 168 hours. BNP (last 3 results) No results for input(s): PROBNP in the last 8760 hours. HbA1C: No results for input(s): HGBA1C in the last 72 hours. CBG: No results for input(s): GLUCAP in the last 168 hours. Lipid Profile: No results for input(s): CHOL, HDL, LDLCALC, TRIG, CHOLHDL, LDLDIRECT in the last 72 hours. Thyroid Function Tests: No results for input(s): TSH, T4TOTAL, FREET4, T3FREE, THYROIDAB in the last 72 hours. Anemia Panel: No results for input(s): VITAMINB12, FOLATE, FERRITIN, TIBC, IRON, RETICCTPCT in the last  72 hours. Urine analysis:    Component Value Date/Time   COLORURINE YELLOW (A) 02/04/2020 2315   APPEARANCEUR CLOUDY (A) 02/04/2020 2315   APPEARANCEUR Clear 07/08/2013 1225   LABSPEC 1.015 02/04/2020 2315   LABSPEC 1.018 07/08/2013 1225   PHURINE 5.0 02/04/2020 2315   GLUCOSEU NEGATIVE 02/04/2020 2315   GLUCOSEU Negative 07/08/2013 1225   HGBUR SMALL (A) 02/04/2020 2315   BILIRUBINUR NEGATIVE 02/04/2020 2315   BILIRUBINUR Negative 07/08/2013 1225   KETONESUR 80 (A) 02/04/2020 2315   PROTEINUR 30 (A) 02/04/2020 2315   NITRITE POSITIVE (A) 02/04/2020 2315   LEUKOCYTESUR LARGE (A) 02/04/2020 2315   LEUKOCYTESUR Negative 07/08/2013 1225   Sepsis Labs: @LABRCNTIP (procalcitonin:4,lacticacidven:4)  )No results found for this or any previous visit (from the past 240 hour(s)).       Radiology Studies: OB Limited  Result Date: 02/05/2020 CLINICAL DATA:  Right lower quadrant abdominal pain. Pregnant, LMP 09/27/2019 EXAM: LIMITED OBSTETRIC ULTRASOUND FINDINGS: Number of Fetuses: 1 Heart Rate:  138 bpm Movement: Yes Presentation: Cephalic Placental Location: Anterior Previa: No Amniotic Fluid (Subjective):  Within normal limits. AFI:   Not calculated Femur length: 2.7 cm 18 w  3 d MATERNAL FINDINGS: Cervix:  Appears closed. Uterus/Adnexae: The maternal right ovary is unremarkable. No free fluid within the cul-de-sac. The maternal left ovary is not visualized. No adnexal masses. IMPRESSION: Single living intrauterine gestation with an estimated gestational age of [redacted] weeks, 3 days. No acute abnormality. This exam is performed on an emergent basis and does not comprehensively evaluate fetal size, dating, or anatomy; follow-up complete OB 19 weeks should be considered if further fetal assessment is warranted. Electronically Signed   By: [place] MD   On: 02/05/2020 01:05   04/04/2020 Renal  Result Date: 02/05/2020 CLINICAL DATA:  Initial evaluation for acute right flank pain. UTI, pregnancy. EXAM:  RENAL / URINARY TRACT ULTRASOUND COMPLETE COMPARISON:  Prior CT from 06/25/2018. FINDINGS: Right Kidney: Renal measurements: To 13.2 x 6.2 x 5.4 cm = volume: 227.8 mL. Increased echogenicity within the renal parenchyma. No visible nephrolithiasis. Moderate hydronephrosis and proximal hydroureter. No focal renal mass. Left Kidney: Renal measurements: 11.9 x 4.6 x 4.2 cm = volume: 119.5 mL. Increased echogenicity within the renal parenchyma. No visible nephrolithiasis. Mild hydronephrosis. No focal renal mass. Bladder: Appears normal for degree of bladder distention. Echogenic material within the bladder could reflect debris and/or sedimentation. Ureteral jets not visualized. Other: Gravid uterus partially visualized. IMPRESSION: 1. Moderate right and  mild left hydronephrosis. No visible nephrolithiasis by sonography. 2. Increased echogenicity within the renal parenchyma, suggesting medical renal disease. 3. Echogenic material within the bladder, which could reflect debris and/or sedimentation. Correlation with urinalysis recommended. Electronically Signed   By: Jeannine Boga M.D.   On: 02/05/2020 01:55   CT RENAL STONE STUDY  Result Date: 02/05/2020 CLINICAL DATA:  Right flank pain.  Known second trimester gestation EXAM: CT ABDOMEN AND PELVIS WITHOUT CONTRAST TECHNIQUE: Multidetector CT imaging of the abdomen and pelvis was performed following the standard protocol without oral or IV contrast. COMPARISON:  Renal ultrasound February 05, 2019; CT abdomen and pelvis Jun 25, 2018. Limited obstetrical ultrasound February 04, 2020 FINDINGS: Lower chest: There is mild atelectatic change in the lateral left base. Lung bases otherwise are clear. Hepatobiliary: No focal liver lesions are appreciable on this noncontrast enhanced study. Gallbladder wall is not appreciably thickened. There is no biliary duct dilatation. Pancreas: There is no pancreatic mass or inflammatory focus. Spleen: No splenic lesions are evident.  Adrenals/Urinary Tract: Adrenals appear normal bilaterally. The right kidney is mildly edematous. There is no renal mass on either side. There is severe hydronephrosis on the right. There is minimal hydronephrosis on the left. There is diffuse dilatation of the right ureter to the level of the upper sacrum. There is no appreciable left ureteral dilatation. There is no evident renal or ureteral calculus on either side. Urinary bladder is midline with wall thickness within normal limits. Mild impression on the superior bladder due to second trimester intrauterine gestation. Stomach/Bowel: No appreciable bowel wall or mesenteric thickening. Moderate stool in colon. No bowel obstruction. Terminal ileum appears normal. No appreciable free air or portal venous air. Vascular/Lymphatic: No abdominal aortic aneurysm. No vascular lesions evident on noncontrast enhanced study. No evident adenopathy in the abdomen or pelvis. Reproductive: There is a fetus within a dilated uterus. Fetal head is in cephalic position. Moderate amniotic fluid, felt to be within normal limits for gestational age. 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 No adnexal masses. Other: No periappendiceal region inflammation evident. No abscess or ascites evident in the abdomen or pelvis. Musculoskeletal: No blastic or lytic bone lesions. No intramuscular or abdominal wall lesions are evident. IMPRESSION: 1. Severe hydronephrosis and ureterectasis on the right with mild right renal edema. Slight hydronephrosis on the left without appreciable left ureterectasis. No ureteral or renal calculus on either side. Suspect hydronephrosis secondary to compression of ureters, more severe on the right than on the left, due to compression from intrauterine gestation with associated uterine enlargement. This circumstance may well warrant urologic consultation. 2.  Normal appearing urinary bladder. 3. Intrauterine gestation with fetal head in  cephalic position. Apparent arcuate uterus. Amniotic fluid volume felt to be grossly normal for gestational age. 4. No bowel wall thickening or bowel obstruction. No abscess in the abdomen or pelvis. Electronically Signed   By: Lowella Grip III M.D.   On: 02/05/2020 11:05        Scheduled Meds: . prenatal multivitamin  1 tablet Oral Q1200   Continuous Infusions: . sodium chloride 75 mL/hr at 02/05/20 0707  . cefTRIAXone (ROCEPHIN)  IV       LOS: 0 days    Time spent: 15 minutes    Edwin Dada, MD [facility] 02/05/2020, 1:44 PM     Please page though [facility] or Epic secure chat:  For password, contact charge nurse

## 2020-02-05 NOTE — Progress Notes (Signed)
Report given to 3rd floor RN, patient transferred.

## 2020-02-06 DIAGNOSIS — O26832 Pregnancy related renal disease, second trimester: Secondary | ICD-10-CM | POA: Diagnosis not present

## 2020-02-06 DIAGNOSIS — Z3A18 18 weeks gestation of pregnancy: Secondary | ICD-10-CM | POA: Diagnosis not present

## 2020-02-06 DIAGNOSIS — N10 Acute tubulo-interstitial nephritis: Secondary | ICD-10-CM | POA: Diagnosis not present

## 2020-02-06 DIAGNOSIS — O2302 Infections of kidney in pregnancy, second trimester: Secondary | ICD-10-CM | POA: Diagnosis not present

## 2020-02-06 LAB — BASIC METABOLIC PANEL
Anion gap: 6 (ref 5–15)
BUN: 6 mg/dL (ref 6–20)
CO2: 23 mmol/L (ref 22–32)
Calcium: 7.5 mg/dL — ABNORMAL LOW (ref 8.9–10.3)
Chloride: 107 mmol/L (ref 98–111)
Creatinine, Ser: 0.43 mg/dL — ABNORMAL LOW (ref 0.44–1.00)
GFR, Estimated: >60 mL/min (ref >60)
Glucose, Bld: 80 mg/dL (ref 70–99)
Potassium: 3.6 mmol/L (ref 3.5–5.1)
Sodium: 136 mmol/L (ref 135–145)

## 2020-02-06 MED ORDER — CEPHALEXIN 500 MG PO CAPS: 500.0000 mg | ORAL_CAPSULE | Freq: Three times a day (TID) | ORAL | 0 refills | Status: AC

## 2020-02-06 MED ORDER — CEPHALEXIN 500 MG PO CAPS: 500.0000 mg | ORAL_CAPSULE | Freq: Every day | ORAL | 2 refills | Status: DC

## 2020-02-06 NOTE — Progress Notes (Signed)
Urology Inpatient Progress Note  Subjective: No acute events overnight. Urine culture pending, on antibiotics as below. Patient reports resolution of right flank pain today.  Anti-infectives: Anti-infectives (From admission, onward)   Start     Dose/Rate Route Frequency Ordered Stop   02/05/20 1800  cefTRIAXone (ROCEPHIN) 1 g in sodium chloride 0.9 % 100 mL IVPB        1 g 200 mL/hr over 30 Minutes Intravenous Every 24 hours 02/05/20 1344 02/09/20 1759   02/05/20 0141  sodium chloride 0.9 % with ceFEPIme (MAXIPIME) ADS Med       Note to Pharmacy: Odis Hollingshead   : cabinet override      02/05/20 0141 02/05/20 1344   02/04/20 2345  cefTRIAXone (ROCEPHIN) 1 g in sodium chloride 0.9 % 100 mL IVPB        1 g 200 mL/hr over 30 Minutes Intravenous  Once 02/04/20 2336 02/05/20 0219      Current Facility-Administered Medications  Medication Dose Route Frequency Provider Last Rate Last Admin  . acetaminophen (TYLENOL) tablet 650 mg  650 mg Oral Q6H PRN Athena Masse, MD   650 mg at 02/06/20 0245   Or  . acetaminophen (TYLENOL) suppository 650 mg  650 mg Rectal Q6H PRN Athena Masse, MD      . cefTRIAXone (ROCEPHIN) 1 g in sodium chloride 0.9 % 100 mL IVPB  1 g Intravenous Q24H Edwin Dada, MD   Stopped at 02/06/20 0245  . HYDROcodone-acetaminophen (NORCO/VICODIN) 5-325 MG per tablet 1-2 tablet  1-2 tablet Oral Q4H PRN Athena Masse, MD   2 tablet at 02/05/20 1252  . ondansetron (ZOFRAN) tablet 4 mg  4 mg Oral Q6H PRN Athena Masse, MD       Or  . ondansetron [facility]) injection 4 mg  4 mg Intravenous Q6H PRN Athena Masse, MD      . prenatal multivitamin tablet 1 tablet  1 tablet Oral Q1200 Athena Masse, MD   1 tablet at 02/05/20 1252   Objective: Vital signs in last 24 hours: Temp:  [97.6 F (36.4 C)-98.3 F (36.8 C)] 98.1 F (36.7 C) (01/07 0840) Pulse Rate:  [77-97] 90 (01/07 0840) Resp:  [16-20] 20 (01/07 0840) BP: (96-116)/(63-72) 98/63 (01/07  0840) SpO2:  [100 %] 100 % (01/07 0840) Weight:  [46 kg] 46 kg (01/06 1941)  Intake/Output from previous day: 01/06 0701 - 01/07 0700 In: 587.9 [I.V.:487.9; IV Piggyback:100] Out: 1200 [Urine:1200] Intake/Output this shift: No intake/output data recorded.  Physical Exam Vitals and nursing note reviewed.  Constitutional:      General: She is not in acute distress.    Appearance: She is not ill-appearing, toxic-appearing or diaphoretic.  HENT:     Head: Normocephalic and atraumatic.  Pulmonary:     Effort: Pulmonary effort is normal. No respiratory distress.  Skin:    General: Skin is warm and dry.  Neurological:     Mental Status: She is alert and oriented to person, place, and time.  Psychiatric:        Mood and Affect: Mood normal.        Behavior: Behavior normal.    Lab Results:  Recent Labs    02/04/20 1805  WBC 9.2  HGB 11.6*  HCT 34.3*  PLT 213   BMET Recent Labs    02/04/20 1805 02/06/20 0236  NA 136 136  K 3.1* 3.6  CL 99 107  CO2 27 23  GLUCOSE 88 80  BUN 9 6  CREATININE 0.58 0.43*  CALCIUM 8.7* 7.5*   Assessment & Plan: 20 year old Covid positive pregnant female at [redacted]w[redacted]d by imaging admitted with right pyelonephritis with bilateral hydronephrosis.  Pain resolved on empiric antibiotics.  No indication for urgent intervention today.  May pursue right ureteral stent versus right nephrostomy tube in the future if her pain returns and is uncontrollable or if she develops worsening signs of infection or sepsis  Recommendations: -Okay for discharge from urologic perspective today on empiric antibiotics.  Follow cultures, recommend 10 to 14 days of culture appropriate therapy.  Debroah Loop, PA-C 02/06/2020

## 2020-02-06 NOTE — Final Progress Note (Signed)
ANTEPARTUM PROGRESS NOTE  Betty Allen is a 20 y.o. [id number] at 45w6dwith EDC of Estimated Date of Delivery: 07/03/20 who is admitted for pyelonephritis.   Length of Stay:  0 Days. Admitted 02/04/2020  Subjective: Pt reports improved flank pain, feeling much better. Seen by urology and cleared for DC today.  Patient reports good fetal movement.  She reports uterine contractions, no bleeding and no loss of fluid per vagina.  Vitals:  BP 98/63 (BP Location: Left Arm)   Pulse 90   Temp 98.1 F (36.7 C) (Oral)   Resp 20   Ht '5\' 2"'  (1.575 m)   Wt 46 kg   LMP 09/27/2019 (Exact Date)   SpO2 100%   BMI 18.55 kg/m   Physical Examination: CONSTITUTIONAL: Well-developed, well-nourished female in no acute distress.  HENT:  Normocephalic, atraumatic EYES: Conjunctivae and EOM are normal. No scleral icterus.  NECK: Normal range of motion, supple, no masses SKIN: Skin is warm and dry. No rash noted. Not diaphoretic. No erythema. No pallor. NRavanna Alert and oriented to person, place, and time.  PSYCHIATRIC: Normal mood and affect. Normal behavior. Normal judgment and thought content. CARDIOVASCULAR: Normal heart rate noted, regular rhythm RESPIRATORY: Effort and breath sounds normal, no problems with respiration noted, lungs clear to ascultation bilaterally MUSCULOSKELETAL: Normal range of motion. No edema and no tenderness. 2+ distal pulses. ABDOMEN: Soft, nontender, nondistended, gravid and nontender. Neg CVAT  Fetal monitoring: FHR:via doppler at 0840. Uterine activity: none  Results for orders placed or performed during the hospital encounter of 02/04/20 (from the past 48 hour(s))  Lipase, blood     Status: None   Collection Time: 02/04/20  6:05 PM  Result Value Ref Range   Lipase 20 11 - 51 U/L    Comment: [facility] [address] [address] Comprehensive metabolic panel     Status: Abnormal   Collection Time: 02/04/20  6:05 PM   Result Value Ref Range   Sodium 136 135 - 145 mmol/L   Potassium 3.1 (L) 3.5 - 5.1 mmol/L   Chloride 99 98 - 111 mmol/L   CO2 27 22 - 32 mmol/L   Glucose, Bld 88 70 - 99 mg/dL    Comment: Glucose reference range applies only to samples taken after fasting for at least 8 hours.   BUN 9 6 - 20 mg/dL   Creatinine, Ser 0.58 0.44 - 1.00 mg/dL   Calcium 8.7 (L) 8.9 - 10.3 mg/dL   Total Protein 7.1 6.5 - 8.1 g/dL   Albumin 3.1 (L) 3.5 - 5.0 g/dL   AST 13 (L) 15 - 41 U/L   ALT 9 0 - 44 U/L   Alkaline Phosphatase 47 38 - 126 U/L   Total Bilirubin 0.6 0.3 - 1.2 mg/dL   GFR, Estimated >60 >60 mL/min    Comment: (NOTE) Calculated using the CKD-EPI Creatinine Equation (2021)    Anion gap 10 5 - 15    Comment: [facility]formed at [facility] [address] [id number] CBC     Status: Abnormal   Collection Time: 02/04/20  6:05 PM  Result Value Ref Range   WBC 9.2 4.0 - 10.5 K/uL   RBC 3.94 3.87 - 5.11 MIL/uL   Hemoglobin 11.6 (L) 12.0 - 15.0 g/dL   HCT 34.3 (L) 36.0 - 46.0 %   MCV 87.1 80.0 - 100.0 fL   MCH 29.4 26.0 - 34.0 pg   MCHC  33.8 30.0 - 36.0 g/dL   RDW 12.5 11.5 - 15.5 %   Platelets 213 150 - 400 K/uL   nRBC 0.0 0.0 - 0.2 %    Comment: Performed at [facility], [address] [place], [place] [address]  hCG, quantitative, pregnancy     Status: Abnormal   Collection Time: 02/04/20  6:05 PM  Result Value Ref Range   hCG, Beta Chain, Quant, S 32,835 (H) <5 mIU/mL    Comment:          GEST. AGE      CONC.  (mIU/mL)   <=1 WEEK        5 - 50     2 WEEKS       50 - 500     3 WEEKS       100 - 10,000     4 WEEKS     1,000 - 30,000     5 WEEKS     3,500 - 115,000   6-8 WEEKS     12,000 - 270,000    12 WEEKS     15,000 - 220,000        FEMALE AND NON-PREGNANT FEMALE:     LESS THAN 5 mIU/mL Performed at [facility], [address] [place], [place] [address]   Urinalysis, Complete w Microscopic Urine, Clean Catch     Status: Abnormal    Collection Time: 02/04/20 11:15 PM  Result Value Ref Range   Color, Urine YELLOW (A) YELLOW   APPearance CLOUDY (A) CLEAR   Specific Gravity, Urine 1.015 1.005 - 1.030   pH 5.0 5.0 - 8.0   Glucose, UA NEGATIVE NEGATIVE mg/dL   Hgb urine dipstick SMALL (A) NEGATIVE   Bilirubin Urine NEGATIVE NEGATIVE   Ketones, ur 80 (A) NEGATIVE mg/dL   Protein, ur 30 (A) NEGATIVE mg/dL   Nitrite POSITIVE (A) NEGATIVE   Leukocytes,Ua LARGE (A) NEGATIVE   RBC / HPF 6-10 0 - 5 RBC/hpf   WBC, UA >50 (H) 0 - 5 WBC/hpf   Bacteria, UA RARE (A) NONE SEEN   Squamous Epithelial / LPF 6-10 0 - 5   WBC Clumps PRESENT    Mucus PRESENT     Comment: Performed at [facility], [address], [place], [place] [address]  Urine Culture     Status: Abnormal (Preliminary result)   Collection Time: 02/04/20 11:15 PM   Specimen: Urine, Random  Result Value Ref Range   Specimen Description      URINE, RANDOM Performed at [facility], [address] [place], [place] [address]    Special Requests      NONE Performed at [facility], [address] [place], [place] [address]    Culture (A)     >=100,000 COLONIES/mL GRAM NEGATIVE RODS SUSCEPTIBILITIES TO FOLLOW Performed at [facility], [place] [address], [place], [place] [address]    Report Status PENDING   SARS CORONAVIRUS 2 (TAT 6-24 HRS) Nasopharyngeal Nasopharyngeal Swab     Status: Abnormal   Collection Time: 02/05/20  2:15 AM   Specimen: Nasopharyngeal Swab  Result Value Ref Range   SARS Coronavirus 2 POSITIVE (A) NEGATIVE    Comment: RESULT CALLED TO, READ BACK BY AND VERIFIED WITH: Notified Mattie Butler,RN @ [phone] on 02/05/2020 by L Lamont (NOTE) SARS-CoV-2 target nucleic acids are DETECTED.  The SARS-CoV-2 RNA is generally detectable in upper and lower respiratory specimens during the acute phase of infection. Positive  results are indicative of the presence of SARS-CoV-2 RNA. Clinical correlation with patient  history and other diagnostic information is  necessary to determine patient infection status. Positive results do not rule out bacterial infection or co-infection with other viruses.  The expected result is Negative.  Fact Sheet for Patients: SugarRoll.be  Fact Sheet for Healthcare Providers: https://www.woods-mathews.com/  This test is not yet approved or cleared by the [place] FDA and  has been authorized for detection and/or diagnosis of SARS-CoV-2 by FDA under an Emergency Use Authorization (EUA). This EUA will remain  in effect (meaning t his test can be used) for the duration of the COVID-19 declaration under Section 564(b)(1) of the Act, 21 U.S.C. section 360bbb-3(b)(1), unless the authorization is terminated or revoked sooner.   Performed at [facility], [place] [address] [place], [place] [address]   POC SARS Coronavirus 2 Ag-ED - Nasal Swab (BD Veritor Kit)     Status: Abnormal   Collection Time: 02/05/20  6:53 AM  Result Value Ref Range   SARS Coronavirus 2 Ag Positive (A) Negative  HIV Antibody (routine testing w rflx)     Status: None   Collection Time: 02/05/20  7:08 AM  Result Value Ref Range   HIV Screen 4th Generation wRfx Non Reactive Non Reactive    Comment: Performed at [facility], [place] [address], [place], [place] [address]  Magnesium     Status: None   Collection Time: 02/05/20  1:56 PM  Result Value Ref Range   Magnesium 1.9 1.7 - 2.4 mg/dL    Comment: Performed at [facility], [address] [place], [place] [address]  Basic metabolic panel     Status: Abnormal   Collection Time: 02/06/20  2:36 AM  Result Value Ref Range   Sodium 136 135 - 145 mmol/L   Potassium 3.6 3.5 - 5.1 mmol/L   Chloride 107 98 - 111 mmol/L   CO2 23 22 - 32 mmol/L   Glucose, Bld 80 70 - 99 mg/dL    Comment: Glucose reference range applies only to samples taken after fasting for at least 8 hours.   BUN 6 6 -  20 mg/dL   Creatinine, Ser 0.43 (L) 0.44 - 1.00 mg/dL   Calcium 7.5 (L) 8.9 - 10.3 mg/dL   GFR, Estimated >60 >60 mL/min    Comment: (NOTE) Calculated using the CKD-EPI Creatinine Equation (2021)    Anion gap 6 5 - 15    Comment: Performed at [facility], [address], [place],  [address]    [place] [employer]  Result Date: 02/05/2020 CLINICAL DATA:  Right lower quadrant abdominal pain. Pregnant, LMP 09/27/2019 EXAM: LIMITED OBSTETRIC ULTRASOUND FINDINGS: Number of Fetuses: 1 Heart Rate:  138 bpm Movement: Yes Presentation: Cephalic Placental Location: Anterior Previa: No Amniotic Fluid (Subjective):  Within normal limits. AFI:   Not calculated Femur length: 2.7 cm 18 w  3 d MATERNAL FINDINGS: Cervix:  Appears closed. Uterus/Adnexae: The maternal right ovary is unremarkable. No free fluid within the cul-de-sac. The maternal left ovary is not visualized. No adnexal masses. IMPRESSION: Single living intrauterine gestation with an estimated gestational age of [redacted] weeks, 3 days. No acute abnormality. This exam is performed on an emergent basis and does not comprehensively evaluate fetal size, dating, or anatomy; follow-up complete OB US should be considered if further fetal assessment is warranted. Electronically Signed   By: Fidela Salisbury MD   On: 02/05/2020 01:05  US Renal  Result Date: 02/05/2020 CLINICAL DATA:  Initial evaluation for acute right flank pain. UTI, pregnancy. EXAM: RENAL / URINARY TRACT ULTRASOUND COMPLETE COMPARISON:  Prior CT from 06/25/2018. FINDINGS: Right Kidney: Renal measurements: To 13.2 x 6.2 x 5.4 cm = volume: 227.8 mL. Increased echogenicity within the renal parenchyma. No visible nephrolithiasis. Moderate hydronephrosis and proximal hydroureter. No focal renal mass. Left Kidney: Renal measurements: 11.9 x 4.6 x 4.2 cm = volume: 119.5 mL. Increased echogenicity within the renal parenchyma. No visible nephrolithiasis. Mild hydronephrosis. No focal renal mass.  Bladder: Appears normal for degree of bladder distention. Echogenic material within the bladder could reflect debris and/or sedimentation. Ureteral jets not visualized. Other: Gravid uterus partially visualized. IMPRESSION: 1. Moderate right and mild left hydronephrosis. No visible nephrolithiasis by sonography. 2. Increased echogenicity within the renal parenchyma, suggesting medical renal disease. 3. Echogenic material within the bladder, which could reflect debris and/or sedimentation. Correlation with urinalysis recommended. Electronically Signed   By: Jeannine Boga M.D.   On: 02/05/2020 01:55   CT RENAL STONE STUDY  Result Date: 02/05/2020 CLINICAL DATA:  Right flank pain.  Known second trimester gestation EXAM: CT ABDOMEN AND PELVIS WITHOUT CONTRAST TECHNIQUE: Multidetector CT imaging of the abdomen and pelvis was performed following the standard protocol without oral or IV contrast. COMPARISON:  Renal ultrasound February 05, 2019; CT abdomen and pelvis Jun 25, 2018. Limited obstetrical ultrasound February 04, 2020 FINDINGS: Lower chest: There is mild atelectatic change in the lateral left base. Lung bases otherwise are clear. Hepatobiliary: No focal liver lesions are appreciable on this noncontrast enhanced study. Gallbladder wall is not appreciably thickened. There is no biliary duct dilatation. Pancreas: There is no pancreatic mass or inflammatory focus. Spleen: No splenic lesions are evident. Adrenals/Urinary Tract: Adrenals appear normal bilaterally. The right kidney is mildly edematous. There is no renal mass on either side. There is severe hydronephrosis on the right. There is minimal hydronephrosis on the left. There is diffuse dilatation of the right ureter to the level of the upper sacrum. There is no appreciable left ureteral dilatation. There is no evident renal or ureteral calculus on either side. Urinary bladder is midline with wall thickness within normal limits. Mild impression on the  superior bladder due to second trimester intrauterine gestation. Stomach/Bowel: No appreciable bowel wall or mesenteric thickening. Moderate stool in colon. No bowel obstruction. Terminal ileum appears normal. No appreciable free air or portal venous air. Vascular/Lymphatic: No abdominal aortic aneurysm. No vascular lesions evident on noncontrast enhanced study. No evident adenopathy in the abdomen or pelvis. Reproductive: There is a fetus within a dilated uterus. Fetal head is in cephalic position. Moderate amniotic fluid, felt to be within normal limits for gestational age. 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 No adnexal masses. Other: No periappendiceal region inflammation evident. No abscess or ascites evident in the abdomen or pelvis. Musculoskeletal: No blastic or lytic bone lesions. No intramuscular or abdominal wall lesions are evident. IMPRESSION: 1. Severe hydronephrosis and ureterectasis on the right with mild right renal edema. Slight hydronephrosis on the left without appreciable left ureterectasis. No ureteral or renal calculus on either side. Suspect hydronephrosis secondary to compression of ureters, more severe on the right than on the left, due to compression from intrauterine gestation with associated uterine enlargement. This circumstance may well warrant urologic consultation. 2.  Normal appearing urinary bladder. 3. Intrauterine gestation with fetal head in cephalic position. Apparent arcuate uterus. Amniotic fluid volume felt to be grossly normal for  gestational age. 4. No bowel wall thickening or bowel obstruction. No abscess in the abdomen or pelvis. Electronically Signed   By: Lowella Grip III M.D.   On: 02/05/2020 11:05    Current scheduled medications . prenatal multivitamin  1 tablet Oral Q1200    I have reviewed the patient's current medications.  ASSESSMENT: Patient Active Problem List   Diagnosis Date Noted  . Bilateral hydronephrosis  02/05/2020  . Acute pyelonephritis 02/05/2020  . Second trimester pregnancy 02/05/2020  . 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 Preterm uterine contractions, antepartum, third trimester 10/30/2017  . resolved Fetal growth retardation, antepartum 10/08/2017    PLAN: DC home with daily prophy for pyelo in pregnancy, keflex 569m PO daily.  Reviewed preg safe medications to take for routine discomforts and current COVID infection.  [facility] [place] [facility] 02/06/2020  12:36 PM

## 2020-02-06 NOTE — Discharge Summary (Signed)
Physician Discharge Summary  Betty Allen [id number] DOB: 2000-10-25 DOA: 02/04/2020  PCP: Patient, No Pcp Per  Admit date: 02/04/2020 Discharge date: 02/06/2020  Admitted From: Home  Disposition:  Home   Recommendations for Outpatient Follow-up:  1. Follow up with [facility] as scheduled 2. Please follow up on the following pending results: Urine culture     Home Health: None  Equipment/Devices: None new  Discharge Condition: Good  CODE STATUS: FULL Diet recommendation: Regular  Brief/Interim Summary: Ms Baisch is a 20 y.o. F G4P1021 at 18 weeks, hx IUGR newborn, hx chlamydia/GC who presented with abdominal pain progressive over several days.  Lower abdominal pain, colicky, without fever, chills.  No vaginal bleeding or leaking.  No vomiting, nausea, no cough, URI symptoms.    In the ER, WBC noted in urine.  US renal obtained that showed moderate hydronephrosis.  Started on ceftriaxone and admitted      [place] DIAGNOSIS: Pyelonephritis    Discharge Diagnoses:   Pyelonephritis   Sepsis ruled out.  Tachycardic, with flank pain and pyuria.  Admitted and started on ceftriaxone.  Imaging ruled out nephrolithiasis.    Urine culture growing gram negative rods.  Patient received 2 days ceftriaxone.  Today feeling good, no fever, nausea or other constitutional symptoms.  Appetite good, pain mild.    Discussed with Dr. Leafy Ro, plan to continue 7 days full dose antibiotics with cephalexin and continue cephalexin prophylaxis through remainder of pregnancy.     Hypokalemia Normalized  Pregnancy Continue prenatal vitamins.  Has OB follow up at discharge.  COVID-19 Incidental finding.  Isolation precautions given.  If she remains asymptomatic, she may discontinue isolation on Jan 11.            Discharge Instructions  Discharge Instructions    Discharge instructions   Complete by: As directed    You were admitted for a bladder  and kidney infection You were treated with antibiotics and fluids  You should continue the antibiotics with cephalexin Take cephalexin/Keflex 500 mg three times daily for the next 5 days starting tonight  You should go see [facility] as soon as possible, their contact info is listed below under the To Do section (the contact info for Drinda Butts)  Resume a prenatal vitamin     For the COVID:  HOW LONG TO REMAIN IN QUARANTINE: You should quarantine until Jan 11 at the earliest, IF YOU HAVE NO SYMPTOMS  If you develop symptoms in the next few days, you should quarantine until Jan 16 Also, if you develop symptoms, you should check your oxygen level twice daily with a pulse oximeter (these are at any pharmacy) if your oxygen level ever drops below 90%, you should be evaluated immediately  Until you end your quarantine: If you have anyone in the home who has NOT had coronavirus:    -do not be in the same room with them until your self isolation is over    -if you MUST be in the same room, make sure you wear a mask and have them wear a mask and safety glasses (if available)    -clean all hard surfaces (counters, doors, tables) twice a day    -use a separate bathroom at all times   Increase activity slowly   Complete by: As directed      Allergies as of 02/06/2020      Reactions   Penicillins Rash   Has patient had a PCN reaction causing immediate rash, facial/tongue/throat swelling,  SOB or lightheadedness with hypotension: Yes Has patient had a PCN reaction causing severe rash involving mucus membranes or skin necrosis: No Has patient had a PCN reaction that required hospitalization No Has patient had a PCN reaction occurring within the last 10 years: No If all of the above answers are "NO", then may proceed with Cephalosporin use.      Medication List    STOP taking these medications   ferrous sulfate 325 (65 FE) MG tablet   ibuprofen 800 MG tablet Commonly known as:  ADVIL   prenatal multivitamin Tabs tablet   sulfamethoxazole-trimethoprim 800-160 MG tablet Commonly known as: Bactrim DS     TAKE these medications   cephALEXin 500 MG capsule Commonly known as: KEFLEX Take 1 capsule (500 mg total) by mouth 3 (three) times daily for 5 days.   cephALEXin 500 MG capsule Commonly known as: KEFLEX Take 1 capsule (500 mg total) by mouth daily at 8 pm. For daily prophylaxis for remainder of pregnancy.   multivitamin-prenatal 27-0.8 MG Tabs tablet Take 1 tablet by mouth daily at 12 noon. What changed: Another medication with the same name was removed. Continue taking this medication, and follow the directions you see here.       Follow-up Information    Minda Meo, CNM. Schedule an appointment as soon as possible for a visit.   Specialty: Certified Nurse Midwife Contact information: [place] [address] [phone]              Allergies  Allergen Reactions  . Penicillins Rash    Has patient had a PCN reaction causing immediate rash, facial/tongue/throat swelling, SOB or lightheadedness with hypotension: Yes Has patient had a PCN reaction causing severe rash involving mucus membranes or skin necrosis: No Has patient had a PCN reaction that required hospitalization No Has patient had a PCN reaction occurring within the last 10 years: No If all of the above answers are "NO", then may proceed with Cephalosporin use.     Consultations:  OB  Urology   Procedures/Studies: US OB Limited  Result Date: 02/05/2020 CLINICAL DATA:  Right lower quadrant abdominal pain. Pregnant, LMP 09/27/2019 EXAM: LIMITED OBSTETRIC ULTRASOUND FINDINGS: Number of Fetuses: 1 Heart Rate:  138 bpm Movement: Yes Presentation: Cephalic Placental Location: Anterior Previa: No Amniotic Fluid (Subjective):  Within normal limits. AFI:   Not calculated Femur length: 2.7 cm 18 w  3 d MATERNAL FINDINGS: Cervix:  Appears closed. Uterus/Adnexae:  The maternal right ovary is unremarkable. No free fluid within the cul-de-sac. The maternal left ovary is not visualized. No adnexal masses. IMPRESSION: Single living intrauterine gestation with an estimated gestational age of [redacted] weeks, 3 days. No acute abnormality. This exam is performed on an emergent basis and does not comprehensively evaluate fetal size, dating, or anatomy; follow-up complete OB US should be considered if further fetal assessment is warranted. Electronically Signed   By: Fidela Salisbury MD   On: 02/05/2020 01:05   US Renal  Result Date: 02/05/2020 CLINICAL DATA:  Initial evaluation for acute right flank pain. UTI, pregnancy. EXAM: RENAL / URINARY TRACT ULTRASOUND COMPLETE COMPARISON:  Prior CT from 06/25/2018. FINDINGS: Right Kidney: Renal measurements: To 13.2 x 6.2 x 5.4 cm = volume: 227.8 mL. Increased echogenicity within the renal parenchyma. No visible nephrolithiasis. Moderate hydronephrosis and proximal hydroureter. No focal renal mass. Left Kidney: Renal measurements: 11.9 x 4.6 x 4.2 cm = volume: 119.5 mL. Increased echogenicity within the renal parenchyma. No visible nephrolithiasis.  Mild hydronephrosis. No focal renal mass. Bladder: Appears normal for degree of bladder distention. Echogenic material within the bladder could reflect debris and/or sedimentation. Ureteral jets not visualized. Other: Gravid uterus partially visualized. IMPRESSION: 1. Moderate right and mild left hydronephrosis. No visible nephrolithiasis by sonography. 2. Increased echogenicity within the renal parenchyma, suggesting medical renal disease. 3. Echogenic material within the bladder, which could reflect debris and/or sedimentation. Correlation with urinalysis recommended. Electronically Signed   By: Jeannine Boga M.D.   On: 02/05/2020 01:55   CT RENAL STONE STUDY  Result Date: 02/05/2020 CLINICAL DATA:  Right flank pain.  Known second trimester gestation EXAM: CT ABDOMEN AND PELVIS WITHOUT  CONTRAST TECHNIQUE: Multidetector CT imaging of the abdomen and pelvis was performed following the standard protocol without oral or IV contrast. COMPARISON:  Renal ultrasound February 05, 2019; CT abdomen and pelvis Jun 25, 2018. Limited obstetrical ultrasound February 04, 2020 FINDINGS: Lower chest: There is mild atelectatic change in the lateral left base. Lung bases otherwise are clear. Hepatobiliary: No focal liver lesions are appreciable on this noncontrast enhanced study. Gallbladder wall is not appreciably thickened. There is no biliary duct dilatation. Pancreas: There is no pancreatic mass or inflammatory focus. Spleen: No splenic lesions are evident. Adrenals/Urinary Tract: Adrenals appear normal bilaterally. The right kidney is mildly edematous. There is no renal mass on either side. There is severe hydronephrosis on the right. There is minimal hydronephrosis on the left. There is diffuse dilatation of the right ureter to the level of the upper sacrum. There is no appreciable left ureteral dilatation. There is no evident renal or ureteral calculus on either side. Urinary bladder is midline with wall thickness within normal limits. Mild impression on the superior bladder due to second trimester intrauterine gestation. Stomach/Bowel: No appreciable bowel wall or mesenteric thickening. Moderate stool in colon. No bowel obstruction. Terminal ileum appears normal. No appreciable free air or portal venous air. Vascular/Lymphatic: No abdominal aortic aneurysm. No vascular lesions evident on noncontrast enhanced study. No evident adenopathy in the abdomen or pelvis. Reproductive: There is a fetus within a dilated uterus. Fetal head is in cephalic position. Moderate amniotic fluid, felt to be within normal limits for gestational age. 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 No adnexal masses. Other: No periappendiceal region inflammation evident. No abscess or ascites evident in the abdomen  or pelvis. Musculoskeletal: No blastic or lytic bone lesions. No intramuscular or abdominal wall lesions are evident. IMPRESSION: 1. Severe hydronephrosis and ureterectasis on the right with mild right renal edema. Slight hydronephrosis on the left without appreciable left ureterectasis. No ureteral or renal calculus on either side. Suspect hydronephrosis secondary to compression of ureters, more severe on the right than on the left, due to compression from intrauterine gestation with associated uterine enlargement. This circumstance may well warrant urologic consultation. 2.  Normal appearing urinary bladder. 3. Intrauterine gestation with fetal head in cephalic position. Apparent arcuate uterus. Amniotic fluid volume felt to be grossly normal for gestational age. 4. No bowel wall thickening or bowel obstruction. No abscess in the abdomen or pelvis. Electronically Signed   By: Lowella Grip III M.D.   On: 02/05/2020 11:05       Subjective: Mild lower abdominal pain and right flank pain.  No fever, no vomiting. Good appetite, no confusion.  No dysuria. No cough, no dyspnea.  Discharge Exam: Vitals:   02/06/20 0204 02/06/20 0840  BP: 96/63 98/63  Pulse: 95 90  Resp: 20 20  Temp: 98.3  F (36.8 C) 98.1 F (36.7 C)  SpO2: 100% 100%   Vitals:   02/05/20 1708 02/05/20 1941 02/06/20 0204 02/06/20 0840  BP: 98/64 101/70 96/63 98/63   Pulse: 97 94 95 90  Resp: 18 20 20 20   Temp: 97.6 F (36.4 C) 98.3 F (36.8 C) 98.3 F (36.8 C) 98.1 F (36.7 C)  TempSrc:    Oral  SpO2: 100% 100% 100% 100%  Weight:  46 kg    Height:        General: Pt is alert, awake, not in acute distress Cardiovascular: RRR, nl S1-S2, no murmurs appreciated.   No LE edema.   Respiratory: Normal respiratory rate and rhythm.  CTAB without rales or wheezes. Abdominal: Abdomen soft and non-tender.  No distension or HSM.   Neuro/Psych: Strength symmetric in upper and lower extremities.  Judgment and insight appear  normal.   The results of significant diagnostics from this hospitalization (including imaging, microbiology, ancillary and laboratory) are listed below for reference.     Microbiology: Recent Results (from the past 240 hour(s))  Urine Culture     Status: Abnormal (Preliminary result)   Collection Time: 02/04/20 11:15 PM   Specimen: Urine, Random  Result Value Ref Range Status   Specimen Description   Final    URINE, RANDOM Performed at [facility], [address], [place], [place] [address]    Special Requests   Final    NONE Performed at [facility], [address] [place], [place] [address]    Culture (A)  Final    >=100,000 COLONIES/mL ESCHERICHIA COLI SUSCEPTIBILITIES TO FOLLOW Performed at [facility], [place] [address], [place], [place] [address]    Report Status PENDING  Incomplete  SARS CORONAVIRUS 2 (TAT 6-24 HRS) Nasopharyngeal Nasopharyngeal Swab     Status: Abnormal   Collection Time: 02/05/20  2:15 AM   Specimen: Nasopharyngeal Swab  Result Value Ref Range Status   SARS Coronavirus 2 POSITIVE (A) NEGATIVE Final    Comment: RESULT CALLED TO, READ BACK BY AND VERIFIED WITH: Notified Mattie Butler,RN @ [id number] on 02/05/2020 by L Lamont (NOTE) SARS-CoV-2 target nucleic acids are DETECTED.  The SARS-CoV-2 RNA is generally detectable in upper and lower respiratory specimens during the acute phase of infection. Positive results are indicative of the presence of SARS-CoV-2 RNA. Clinical correlation with patient history and other diagnostic information is  necessary to determine patient infection status. Positive results do not rule out bacterial infection or co-infection with other viruses.  The expected result is Negative.  Fact Sheet for Patients: SugarRoll.be  Fact Sheet for Healthcare Providers: https://www.woods-mathews.com/  This test is not yet approved or cleared by the [place] FDA and   has been authorized for detection and/or diagnosis of SARS-CoV-2 by FDA under an Emergency Use Authorization (EUA). This EUA will remain  in effect (meaning t his test can be used) for the duration of the COVID-19 declaration under Section 564(b)(1) of the Act, 21 U.S.C. section 360bbb-3(b)(1), unless the authorization is terminated or revoked sooner.   Performed at [facility], [place] [address] [place], [place] [address]      Labs: BNP (last 3 results) No results for input(s): BNP in the last 8760 hours. Basic Metabolic Panel: Recent Labs  Lab 02/04/20 1805 02/05/20 1356 02/06/20 0236  NA 136  --  136  K 3.1*  --  3.6  CL 99  --  107  CO2 27  --  23  GLUCOSE 88  --  80  BUN 9  --  6  CREATININE 0.58  --  0.43*  CALCIUM 8.7*  --  7.5*  MG  --  1.9  --    Liver Function Tests: Recent Labs  Lab 02/04/20 1805  AST 13*  ALT 9  ALKPHOS 47  BILITOT 0.6  PROT 7.1  ALBUMIN 3.1*   Recent Labs  Lab 02/04/20 1805  LIPASE 20   No results for input(s): AMMONIA in the last 168 hours. CBC: Recent Labs  Lab 02/04/20 1805  WBC 9.2  HGB 11.6*  HCT 34.3*  MCV 87.1  PLT 213   Cardiac Enzymes: No results for input(s): CKTOTAL, CKMB, CKMBINDEX, TROPONINI in the last 168 hours. BNP: Invalid input(s): POCBNP CBG: No results for input(s): GLUCAP in the last 168 hours. D-Dimer No results for input(s): DDIMER in the last 72 hours. Hgb A1c No results for input(s): HGBA1C in the last 72 hours. Lipid Profile No results for input(s): CHOL, HDL, LDLCALC, TRIG, CHOLHDL, LDLDIRECT in the last 72 hours. Thyroid function studies No results for input(s): TSH, T4TOTAL, T3FREE, THYROIDAB in the last 72 hours.  Invalid input(s): FREET3 Anemia work up No results for input(s): VITAMINB12, FOLATE, FERRITIN, TIBC, IRON, RETICCTPCT in the last 72 hours. Urinalysis    Component Value Date/Time   COLORURINE YELLOW (A) 02/04/2020 2315   APPEARANCEUR CLOUDY (A) 02/04/2020 2315    APPEARANCEUR Clear 07/08/2013 1225   LABSPEC 1.015 02/04/2020 2315   LABSPEC 1.018 07/08/2013 1225   PHURINE 5.0 02/04/2020 2315   GLUCOSEU NEGATIVE 02/04/2020 2315   GLUCOSEU Negative 07/08/2013 1225   HGBUR SMALL (A) 02/04/2020 2315   BILIRUBINUR NEGATIVE 02/04/2020 2315   BILIRUBINUR Negative 07/08/2013 1225   KETONESUR 80 (A) 02/04/2020 2315   PROTEINUR 30 (A) 02/04/2020 2315   NITRITE POSITIVE (A) 02/04/2020 2315   LEUKOCYTESUR LARGE (A) 02/04/2020 2315   LEUKOCYTESUR Negative 07/08/2013 1225   Sepsis Labs Invalid input(s): PROCALCITONIN,  WBC,  LACTICIDVEN Microbiology Recent Results (from the past 240 hour(s))  Urine Culture     Status: Abnormal (Preliminary result)   Collection Time: 02/04/20 11:15 PM   Specimen: Urine, Random  Result Value Ref Range Status   Specimen Description   Final    URINE, RANDOM Performed at [facility], [address] [place], [place] [address]    Special Requests   Final    NONE Performed at [facility], [address], [place], [place] [address]    Culture (A)  Final    >=100,000 COLONIES/mL ESCHERICHIA COLI SUSCEPTIBILITIES TO FOLLOW Performed at [facility], [place] [address], [place], [place] [address]    Report Status PENDING  Incomplete  SARS CORONAVIRUS 2 (TAT 6-24 HRS) Nasopharyngeal Nasopharyngeal Swab     Status: Abnormal   Collection Time: 02/05/20  2:15 AM   Specimen: Nasopharyngeal Swab  Result Value Ref Range Status   SARS Coronavirus 2 POSITIVE (A) NEGATIVE Final    Comment: RESULT CALLED TO, READ BACK BY AND VERIFIED WITH: Notified Mattie Butler,RN @ [id number] on 02/05/2020 by L Lamont (NOTE) SARS-CoV-2 target nucleic acids are DETECTED.  The SARS-CoV-2 RNA is generally detectable in upper and lower respiratory specimens during the acute phase of infection. Positive results are indicative of the presence of SARS-CoV-2 RNA. Clinical correlation with patient history and other diagnostic  information is  necessary to determine patient infection status. Positive results do not rule out bacterial infection or co-infection with other viruses.  The expected result is Negative.  Fact Sheet for Patients: SugarRoll.be  Fact Sheet for Healthcare Providers: https://www.woods-mathews.com/  This test is not yet approved or cleared by the [place] FDA and  has been authorized for detection and/or diagnosis of SARS-CoV-2 by FDA under an Emergency Use Authorization (EUA). This EUA will remain  in effect (meaning t his test can be used) for the duration of the COVID-19 declaration under Section 564(b)(1) of the Act, 21 U.S.C. section 360bbb-3(b)(1), unless the authorization is terminated or revoked sooner.   Performed at [facility], [place] [address] [place], [place] [address]      Time coordinating discharge: 35 minutes       SIGNED:   Edwin Dada, MD  [facility] 02/06/2020, 10:53 PM

## 2020-02-07 LAB — URINE CULTURE: Culture: >=100000 — AB

## 2020-02-23 ENCOUNTER — Ambulatory Visit: Payer: Medicaid Other | Admitting: Family Medicine

## 2020-02-23 ENCOUNTER — Other Ambulatory Visit: Payer: Self-pay

## 2020-02-23 VITALS — BP 105/63 | HR 91 | Temp 99.0°F | Wt 105.8 lb

## 2020-02-23 DIAGNOSIS — O0930 Supervision of pregnancy with insufficient antenatal care, unspecified trimester: Secondary | ICD-10-CM

## 2020-02-23 DIAGNOSIS — O2301 Infections of kidney in pregnancy, first trimester: Secondary | ICD-10-CM

## 2020-02-23 DIAGNOSIS — O365990 Maternal care for other known or suspected poor fetal growth, unspecified trimester, not applicable or unspecified: Secondary | ICD-10-CM | POA: Insufficient documentation

## 2020-02-23 DIAGNOSIS — O09899 Supervision of other high risk pregnancies, unspecified trimester: Secondary | ICD-10-CM

## 2020-02-23 DIAGNOSIS — O0990 Supervision of high risk pregnancy, unspecified, unspecified trimester: Secondary | ICD-10-CM | POA: Diagnosis not present

## 2020-02-23 DIAGNOSIS — O09299 Supervision of pregnancy with other poor reproductive or obstetric history, unspecified trimester: Secondary | ICD-10-CM

## 2020-02-23 LAB — URINALYSIS
Bilirubin, UA: NEGATIVE
Glucose, UA: NEGATIVE
Ketones, UA: NEGATIVE
Nitrite, UA: NEGATIVE
Protein,UA: NEGATIVE
RBC, UA: NEGATIVE
Specific Gravity, UA: 1.025 (ref 1.005–1.030)
Urobilinogen, Ur: 0.2 mg/dL (ref 0.2–1.0)
pH, UA: 6.5 (ref 5.0–7.5)

## 2020-02-23 LAB — HEMOGLOBIN, FINGERSTICK: Hemoglobin: 11.2 g/dL (ref 11.1–15.9)

## 2020-02-23 NOTE — Progress Notes (Signed)
In house labs reviewed and no intervention needed per standing order. [facility] referral for anatomy US and genetic testing sent electronically by Dr. Ernestina Patches. Vonita Moss RN called for appt and gave appt card to client (03/02/2020 9 am - [place] and 10 am, genetic testing).  Rich Number, RN

## 2020-02-23 NOTE — Progress Notes (Signed)
Covid positive 02/05/2020 and quarantine ended 02/17/2020 per client. Reports works for [employer] and receives Covid test each Wednesday. Per client, rapid test was negatrive 02/18/2020. Presents today for initiation of prenatal care and taing PNV daily. Rich Number, RN

## 2020-02-23 NOTE — Progress Notes (Signed)
[place] [id number] [phone]  INITIAL PRENATAL VISIT NOTE  Subjective:  Betty Allen  is a 20 y.o. G4P1021 at [redacted]w[redacted]d being seen today to start prenatal care at the [facility].  She is currently monitored for the following issues for this high-risk pregnancy and has Fibroids, submucosal; Bilateral hydronephrosis; Acute pyelonephritis; Supervision of high risk pregnancy, antepartum; High risk teen pregnancy, antepartum; Pyelonephritis affecting pregnancy in first trimester; and Late prenatal care on their problem list.  Patient reports no complaints.  Contractions: Not present. Vag. Bleeding: None.  Movement: Present. Denies leaking of fluid.   Indications for ASA therapy (per uptodate) One of the following: Previous pregnancy with preeclampsia, especially early onset and with an adverse outcome No Multifetal gestation No Chronic hypertension No Type 1 or 2 diabetes mellitus No Chronic kidney disease No Autoimmune disease (antiphospholipid syndrome, systemic lupus erythematosus) No  Two or more of the following: Nulliparity No Obesity (body mass index >30 kg/m2) No Family history of preeclampsia in mother or sister No Age ?35 years No Sociodemographic characteristics (African American race, low socioeconomic level) Yes Personal risk factors (eg, previous pregnancy with low birth weight or small for gestational age infant, previous adverse pregnancy outcome [eg, stillbirth], interval >10 years between pregnancies) No   The following portions of the patient's history were reviewed and updated as appropriate: allergies, current medications, past family history, past medical history, past social history, past surgical history and problem list. Problem list updated.  Objective:   Vitals:   02/23/20 1345  BP: 105/63  Pulse: 91  Temp: 99 F (37.2 C)   Weight: 105 lb 12.8 oz (48 kg)    Fetal Status: Fetal Heart Rate (bpm): 145 Fundal Height: 21 cm Movement: Present      Physical Exam Vitals and nursing note reviewed.  Constitutional:      General: She is not in acute distress.    Appearance: Normal appearance. She is well-developed.  HENT:     Head: Normocephalic and atraumatic.     Right Ear: External ear normal.     Left Ear: External ear normal.     Nose: Nose normal. No congestion or rhinorrhea.     Mouth/Throat:     Lips: Pink.     Mouth: Mucous membranes are moist.     Dentition: Normal dentition. No dental caries.     Pharynx: Oropharynx is clear. Uvula midline.  Eyes:     General: No scleral icterus.    Conjunctiva/sclera: Conjunctivae normal.  Neck:     Thyroid: No thyroid mass or thyromegaly.  Cardiovascular:     Rate and Rhythm: Normal rate.     Pulses: Normal pulses.     Comments: Extremities are warm and well perfused Pulmonary:     Effort: Pulmonary effort is normal.     Breath sounds: Normal breath sounds.  Chest:     Chest wall: No mass.  Breasts:     Tanner Score is 5. Breasts are symmetrical.     Right: Normal. No mass, nipple discharge, skin change or axillary adenopathy.     Left: Normal. No mass, nipple discharge, skin change or axillary adenopathy.    Abdominal:     General: Abdomen is flat.     Palpations: Abdomen is soft.     Tenderness: There is no abdominal tenderness.     Comments: Gravid   Genitourinary:    General:  Normal vulva.     Exam position: Lithotomy position.     Pubic Area: No rash.      Labia:        Right: No rash.        Left: No rash.      Vagina: Normal. No vaginal discharge.     Cervix: No cervical motion tenderness or friability.     Uterus: Normal. Enlarged (Gravid 21cm). Not tender.      Adnexa: Right adnexa normal and left adnexa normal.     Rectum: Normal. No external hemorrhoid.  Musculoskeletal:     Right lower leg: No edema.     Left lower leg: No  edema.  Lymphadenopathy:     Upper Body:     Right upper body: No axillary adenopathy.     Left upper body: No axillary adenopathy.  Skin:    General: Skin is warm.     Capillary Refill: Capillary refill takes less than 2 seconds.  Neurological:     Mental Status: She is alert.     Assessment and Plan:  Pregnancy: G4P1021 at [redacted]w[redacted]d  1. Supervision of high risk pregnancy, antepartum Happy about pregnancy, unplanned pregnancy. Has 20 yo boy at home. Works at [employer] and would like to continue to work until delivery.   Signed up for presumptive/Medicaid today Desires genetic screening- referral placed [place] and Genetic counseling scheduled for 2/1 at 9:00 AM and 10 AM  - Urine Culture - Chlamydia/GC NAA, Confirmation - HIV-1/HIV-2 Qualitative RNA - HCV Ab w Reflex to Quant PCR - Prenatal profile without Varicella/Rubella ([id number]) - Urinalysis (Urine Dip) - Hemoglobin, venipuncture - [place] MFM OB COMP + 14 WK; Future  2. High risk teen pregnancy, antepartum - Desires NIPT, MFM genetic counselor referral placed today - [place] MFM OB COMP + 31 WK; Future  3. Pyelonephritis affecting pregnancy in first trimester Has not started suppression Discussed importance of starting daily keflex. Has bottle and will start.   4. Late prenatal care - [place] MFM OB COMP + 14 WK; Future  Discussed overview of care and coordination with inpatient delivery practices including [facility], [facility], [facility] and [facility].   Preterm labor symptoms and general obstetric precautions including but not limited to vaginal bleeding, contractions, leaking of fluid and fetal movement were reviewed in detail with the patient.  Please refer to After Visit Summary for other counseling recommendations.   Return in about 4 weeks (around 03/22/2020) for Routine prenatal care.  Future Appointments  Date Time Provider [place]  03/02/2020  9:00 AM [facility]  03/02/2020 10:00 AM  [facility] GENETIC RM [facility] None  03/22/2020 11:00 AM [facility] PROVIDER [facility] None    Caren Macadam, MD

## 2020-02-24 LAB — HCV AB W REFLEX TO QUANT PCR: HCV Ab: <0.1 s/co ratio (ref 0.0–0.9)

## 2020-02-24 LAB — CBC/D/PLT+RPR+RH+ABO+AB SCR
Antibody Screen: NEGATIVE
Basophils Absolute: 0 10*3/uL (ref 0.0–0.2)
Basos: 0 %
EOS (ABSOLUTE): 0.1 10*3/uL (ref 0.0–0.4)
Eos: 1 %
Hematocrit: 32.4 % — ABNORMAL LOW (ref 34.0–46.6)
Hemoglobin: 11.3 g/dL (ref 11.1–15.9)
Hepatitis B Surface Ag: NEGATIVE
Immature Grans (Abs): 0.1 10*3/uL (ref 0.0–0.1)
Immature Granulocytes: 1 %
Lymphocytes Absolute: 1.5 10*3/uL (ref 0.7–3.1)
Lymphs: 12 %
MCH: 29.4 pg (ref 26.6–33.0)
MCHC: 34.9 g/dL (ref 31.5–35.7)
MCV: 84 fL (ref 79–97)
Monocytes Absolute: 0.7 10*3/uL (ref 0.1–0.9)
Monocytes: 5 %
Neutrophils Absolute: 10.2 10*3/uL — ABNORMAL HIGH (ref 1.4–7.0)
Neutrophils: 81 %
Platelets: 247 10*3/uL (ref 150–450)
RBC: 3.84 x10E6/uL (ref 3.77–5.28)
RDW: 12.3 % (ref 11.7–15.4)
RPR Ser Ql: NONREACTIVE
Rh Factor: POSITIVE
WBC: 12.6 10*3/uL — ABNORMAL HIGH (ref 3.4–10.8)

## 2020-02-24 LAB — HCV INTERPRETATION

## 2020-02-24 LAB — HIV-1/HIV-2 QUALITATIVE RNA
HIV-1 RNA, Qualitative: NONREACTIVE
HIV-2 RNA, Qualitative: NONREACTIVE

## 2020-02-25 LAB — URINE CULTURE: Organism ID, Bacteria: NO GROWTH

## 2020-02-27 ENCOUNTER — Telehealth: Payer: Self-pay

## 2020-02-27 DIAGNOSIS — A749 Chlamydial infection, unspecified: Secondary | ICD-10-CM | POA: Insufficient documentation

## 2020-02-27 LAB — CHLAMYDIA/GC NAA, CONFIRMATION
Chlamydia trachomatis, NAA: POSITIVE — AB
Neisseria gonorrhoeae, NAA: NEGATIVE

## 2020-02-27 LAB — C. TRACHOMATIS NAA, CONFIRM: C. trachomatis NAA, Confirm: POSITIVE — AB

## 2020-02-27 NOTE — Telephone Encounter (Signed)
Chlamydia culture positive. Call to client and left message to call when clinic opens on Monday am as needs appt to treat an infection. Number to call provided. Rich Number, RN

## 2020-03-01 ENCOUNTER — Telehealth: Payer: Self-pay

## 2020-03-01 NOTE — Telephone Encounter (Signed)
Phone encounter opened in error 03/01/2020. Please refer to previously opened phone encounter. Rich Number, RN

## 2020-03-01 NOTE — Telephone Encounter (Signed)
Call to client and left message that she needs appt to treat an infection. Requested she call so we could counsel her regarding treatment and schedule her appt. Number to call provided. Rich Number, RN

## 2020-03-02 ENCOUNTER — Encounter: Payer: Self-pay | Admitting: Family Medicine

## 2020-03-02 ENCOUNTER — Ambulatory Visit (HOSPITAL_BASED_OUTPATIENT_CLINIC_OR_DEPARTMENT_OTHER): Payer: Medicaid Other

## 2020-03-02 ENCOUNTER — Other Ambulatory Visit
Admission: RE | Admit: 2020-03-02 | Discharge: 2020-03-02 | Disposition: A | Discharge status: Home / Self Care | Payer: Medicaid Other | Source: Ambulatory Visit | Attending: Maternal & Fetal Medicine | Admitting: Maternal & Fetal Medicine

## 2020-03-02 ENCOUNTER — Other Ambulatory Visit: Payer: Self-pay

## 2020-03-02 DIAGNOSIS — O2301 Infections of kidney in pregnancy, first trimester: Secondary | ICD-10-CM | POA: Diagnosis not present

## 2020-03-02 DIAGNOSIS — Z36 Encounter for antenatal screening for chromosomal anomalies: Secondary | ICD-10-CM

## 2020-03-02 DIAGNOSIS — Z3A22 22 weeks gestation of pregnancy: Secondary | ICD-10-CM

## 2020-03-02 DIAGNOSIS — O0930 Supervision of pregnancy with insufficient antenatal care, unspecified trimester: Secondary | ICD-10-CM

## 2020-03-02 DIAGNOSIS — O365920 Maternal care for other known or suspected poor fetal growth, second trimester, not applicable or unspecified: Secondary | ICD-10-CM

## 2020-03-02 DIAGNOSIS — O09899 Supervision of other high risk pregnancies, unspecified trimester: Secondary | ICD-10-CM | POA: Diagnosis not present

## 2020-03-02 DIAGNOSIS — O0990 Supervision of high risk pregnancy, unspecified, unspecified trimester: Secondary | ICD-10-CM | POA: Diagnosis not present

## 2020-03-02 DIAGNOSIS — Z419 Encounter for procedure for purposes other than remedying health state, unspecified: Secondary | ICD-10-CM | POA: Diagnosis not present

## 2020-03-02 DIAGNOSIS — O321XX0 Maternal care for breech presentation, not applicable or unspecified: Secondary | ICD-10-CM

## 2020-03-02 DIAGNOSIS — A749 Chlamydial infection, unspecified: Secondary | ICD-10-CM

## 2020-03-02 DIAGNOSIS — O98512 Other viral diseases complicating pregnancy, second trimester: Secondary | ICD-10-CM | POA: Insufficient documentation

## 2020-03-02 DIAGNOSIS — O09299 Supervision of pregnancy with other poor reproductive or obstetric history, unspecified trimester: Secondary | ICD-10-CM

## 2020-03-02 NOTE — Progress Notes (Signed)
Betty Allen Length of Consultation: 15 minutes   Betty Allen  was referred to [facility] at [facility] for genetic counseling to review prenatal screening and testing options.  This note summarizes the information we discussed.    We offered the following routine screening tests for this pregnancy:  Cell free fetal DNA testing from maternal blood may be used to determine whether a baby is at high risk for Down syndrome, trisomy 57, or trisomy 72.  This test utilizes a maternal blood sample and DNA sequencing technology to isolate circulating cell free fetal DNA from maternal plasma.  The fetal DNA can then be analyzed for DNA sequences that are derived from the three most common chromosomes involved in aneuploidy, chromosomes 13, 18, and 21.  If the overall amount of DNA is greater than the expected level for any of these chromosomes, aneuploidy is suspected.  The detection rate for Down syndrome and trisomy 18 is >99% and the detection rate for trisomy 13 is >91%. While we do not consider it a replacement for invasive testing and karyotype analysis, a negative result from this testing would be reassuring, though not a guarantee of a normal chromosome complement for the baby.  An abnormal result is certainly suggestive of an abnormal chromosome complement, though we would still recommend CVS or amniocentesis to confirm any findings from this testing. This testing can also assess for the sex chromosomes and can detect approximately 96% of sex chromosome aneuploidies and determine fetal gender with >99% confidence.    First trimester screening, which includes nuchal translucency ultrasound screen and first trimester maternal serum marker screening.  The nuchal translucency has approximately an 80% detection rate for Down syndrome and can be positive for other chromosome abnormalities as well as congenital heart defects.  When combined with a maternal serum marker screening, the  detection rate is up to 90% for Down syndrome and up to 97% for trisomy 18.     Maternal serum marker screening, a blood test that measures pregnancy proteins, can provide risk assessments for Down syndrome, trisomy 18, and open neural tube defects (spina bifida, anencephaly). Because it does not directly examine the fetus, it cannot positively diagnose or rule out these problems.  Targeted ultrasound uses high frequency sound waves to create an image of the developing fetus.  An ultrasound is often recommended as a routine means of evaluating the pregnancy.  It is also used to screen for fetal anatomy problems (for example, a heart defect) that might be suggestive of a chromosomal or other abnormality.   Should these screening tests indicate an increased concern, then the following additional testing options would be offered:  The chorionic villus sampling procedure is available for first trimester chromosome analysis.  This involves the withdrawal of a small amount of chorionic villi (tissue from the developing placenta).  Risk of pregnancy loss is estimated to be approximately 1 in 200 to 1 in 100 (0.5 to 1%).  There is approximately a 1% (1 in 100) chance that the CVS chromosome results will be unclear.  Chorionic villi cannot be tested for neural tube defects.     Amniocentesis involves the removal of a small amount of amniotic fluid from the sac surrounding the fetus with the use of a thin needle inserted through the maternal abdomen and uterus.  Ultrasound guidance is used throughout the procedure.  Fetal cells from amniotic fluid are directly evaluated and > 99.5% of chromosome problems and > 98% of open neural  tube defects can be detected. This procedure is generally performed after the 15th week of pregnancy.  The main risks to this procedure include complications leading to miscarriage in less than 1 in 200 cases (0.5%).  Cystic Fibrosis and Spinal Muscular Atrophy (SMA) screening were also  discussed with the patient. Both conditions are recessive, which means that both parents must be carriers in order to have a child with the disease.  Cystic fibrosis (CF) is one of the most common genetic conditions in persons of Caucasian ancestry.  This condition occurs in approximately 1 in 2,500 Caucasian persons and results in thickened secretions in the lungs, digestive, and reproductive systems.  For a baby to be at risk for having CF, both of the parents must be carriers for this condition.  Approximately 1 in 86 Caucasian persons is a carrier for CF.  Current carrier testing looks for the most common mutations in the gene for CF and can detect approximately 90% of carriers in the Caucasian population.  This means that the carrier screening can greatly reduce, but cannot eliminate, the chance for an individual to have a child with CF.  If an individual is found to be a carrier for CF, then carrier testing would be available for the partner. As part of [place] newborn screening profile, all babies born in the state of [place] will have a two-tier screening process.  Specimens are first tested to determine the concentration of immunoreactive trypsinogen (IRT).  The top 5% of specimens with the highest IRT values then undergo DNA testing using a panel of over 40 common CF mutations. SMA is a neurodegenerative disorder that leads to atrophy of skeletal muscle and overall weakness.  This condition is also more prevalent in the Caucasian population, with 1 in 40-1 in 60 persons being a carrier and 1 in 6,000-1 in 10,000 children being affected.  There are multiple forms of the disease, with some causing death in infancy to other forms with survival into adulthood.  The genetics of SMA is complex, but carrier screening can detect up to 95% of carriers in the Caucasian population.  Similar to CF, a negative result can greatly reduce, but cannot eliminate, the chance to have a child with SMA.  Hemoglobinopathy screening was also made available.  We reviewed the detailed family history and pregnancy history obtained at her genetic counseling visit in 2019.  There were no changes to the history, and the family history was reported to be unremarkable for birth defects, intellectual delays, recurrent pregnancy loss or known chromosome abnormalities.   Betty Allen stated that this is her fourth pregnancy.  She had as early miscarriage in her first pregnancy.  With her current partner, she has a healthy 31 year old son and one 6 week miscarriage.  In the current pregnancy, she reported no complications or exposures to medications, alcohol, tobacco or recreational drugs.  After consideration of the options, Betty Allen elected to proceed with MaterniT21 and AFP screening. She declined carrier screening for CF and SMA.  Prior hemoglobinopathy screening was normal (AA).  An ultrasound was performed at the time of the visit.  The gestational age was consistent with 57 weeks.  The routine fetal anatomy was seen and appeared normal.  No markers of aneuploidy were noted, though this cannot exclude a chromosome condition or other birth defect in the pregnancy.  Please refer to the ultrasound report for details of that study.  Betty Allen was encouraged to call with questions or  concerns.  We can be contacted at [phone].  Plan of care: [facility] Anatomy ultrasound today . MaterniT21 PLUS with SCA drawn today . msAFP only drawn today   Labs ordered: MaterniT21 PLUS with SCA,   Wilburt Finlay, MS, CGC

## 2020-03-02 NOTE — Telephone Encounter (Signed)
Call received from client and counseled needs treatment for chlamydia. Per client, due to work schedule, can't come for appt until 03/08/2020. Appt schedule for 03/08/2020 in early am per client request. Rich Number, RN

## 2020-03-02 NOTE — Telephone Encounter (Signed)
Call to [facility] (employer) and requested to speak with client as calling from her doctor's office. Per female answering phone, client is off today. Call to client and left message requesting she call us as needs appt for medicine to treat an infection. Number to call provided. Call to emergency contact and left message requesting assistance contacting client to call maternity clinic as needs appt for medication. Number to call provided. Rich Number, RN

## 2020-03-04 LAB — MISC LABCORP TEST (SEND OUT): Labcorp test code: 10801

## 2020-03-06 LAB — MATERNIT21 PLUS CORE+SCA
Fetal Fraction: 12
Monosomy X (Turner Syndrome): NOT DETECTED
Result (T21): NEGATIVE
Trisomy 13 (Patau syndrome): NEGATIVE
Trisomy 18 (Edwards syndrome): NEGATIVE
Trisomy 21 (Down syndrome): NEGATIVE
XXX (Triple X Syndrome): NOT DETECTED
XXY (Klinefelter Syndrome): NOT DETECTED
XYY (Jacobs Syndrome): NOT DETECTED

## 2020-03-08 ENCOUNTER — Other Ambulatory Visit: Payer: Medicaid Other

## 2020-03-08 ENCOUNTER — Other Ambulatory Visit: Payer: Self-pay

## 2020-03-08 DIAGNOSIS — O98812 Other maternal infectious and parasitic diseases complicating pregnancy, second trimester: Secondary | ICD-10-CM

## 2020-03-08 DIAGNOSIS — A749 Chlamydial infection, unspecified: Secondary | ICD-10-CM

## 2020-03-08 MED ORDER — AZITHROMYCIN 500 MG PO TABS
1000.0000 mg | ORAL_TABLET | Freq: Once | ORAL | Status: AC
Start: 2020-03-08 — End: 2020-03-08
  Administered 2020-03-08: 1000 mg via ORAL

## 2020-03-08 NOTE — Progress Notes (Signed)
Treated for chlamydia with Azithromycin per [facility] Dr. Raliegh Ip. [place]. Tolerated well. Advised to call [facility] if vomits within 2 hrs of taking med. Reports eating before arrival today. States partner has been treated for chlamydia. Questions answered and reports understanding. Josie Saunders, RN

## 2020-03-22 ENCOUNTER — Ambulatory Visit: Payer: Self-pay

## 2020-03-24 ENCOUNTER — Ambulatory Visit: Payer: Self-pay

## 2020-03-29 ENCOUNTER — Ambulatory Visit: Payer: Medicaid Other | Admitting: Family Medicine

## 2020-03-29 ENCOUNTER — Other Ambulatory Visit: Payer: Self-pay

## 2020-03-29 VITALS — BP 111/76 | HR 99 | Temp 97.9°F | Wt 114.4 lb

## 2020-03-29 DIAGNOSIS — O0932 Supervision of pregnancy with insufficient antenatal care, second trimester: Secondary | ICD-10-CM

## 2020-03-29 DIAGNOSIS — A749 Chlamydial infection, unspecified: Secondary | ICD-10-CM

## 2020-03-29 DIAGNOSIS — O0992 Supervision of high risk pregnancy, unspecified, second trimester: Secondary | ICD-10-CM

## 2020-03-29 DIAGNOSIS — O2301 Infections of kidney in pregnancy, first trimester: Secondary | ICD-10-CM

## 2020-03-29 DIAGNOSIS — O98512 Other viral diseases complicating pregnancy, second trimester: Secondary | ICD-10-CM

## 2020-03-29 DIAGNOSIS — O09299 Supervision of pregnancy with other poor reproductive or obstetric history, unspecified trimester: Secondary | ICD-10-CM

## 2020-03-29 DIAGNOSIS — O2302 Infections of kidney in pregnancy, second trimester: Secondary | ICD-10-CM

## 2020-03-29 DIAGNOSIS — O98812 Other maternal infectious and parasitic diseases complicating pregnancy, second trimester: Secondary | ICD-10-CM

## 2020-03-29 DIAGNOSIS — O0930 Supervision of pregnancy with insufficient antenatal care, unspecified trimester: Secondary | ICD-10-CM

## 2020-03-29 DIAGNOSIS — O0990 Supervision of high risk pregnancy, unspecified, unspecified trimester: Secondary | ICD-10-CM

## 2020-03-29 DIAGNOSIS — U071 COVID-19: Secondary | ICD-10-CM

## 2020-03-29 NOTE — Progress Notes (Signed)
   PRENATAL VISIT NOTE  Subjective:  Betty Allen is a 20 y.o. G4P1021 at [redacted]w[redacted]d being seen today for ongoing prenatal care.  She is currently monitored for the following issues for this high-risk pregnancy and has Fibroids, submucosal; Supervision of high risk pregnancy, antepartum; High risk teen pregnancy, antepartum; Pyelonephritis affecting pregnancy in first trimester; Late prenatal care; Prior pregnancy complicated by IUGR, antepartum; Chlamydia infection affecting pregnancy in second trimester; and COVID-19 affecting pregnancy in second trimester on their problem list.  Patient reports backache. Reports pelvic pain with with changes in position and prolonged standing.   Contractions: Not present. Vag. Bleeding: None.  Movement: Present. Denies leaking of fluid/ROM.   The following portions of the patient's history were reviewed and updated as appropriate: allergies, current medications, past family history, past medical history, past social history, past surgical history and problem list. Problem list updated.  Objective:   Vitals:   03/29/20 0900  BP: 111/76  Pulse: 99  Temp: 97.9 F (36.6 C)  Weight: 114 lb 6.4 oz (51.9 kg)    Fetal Status: Fetal Heart Rate (bpm): 150 Fundal Height: 26 cm Movement: Present     General:  Alert, oriented and cooperative. Patient is in no acute distress.  Skin: Skin is warm and dry. No rash noted.   Cardiovascular: Normal heart rate noted  Respiratory: Normal respiratory effort, no problems with respiration noted  Abdomen: Soft, gravid, appropriate for gestational age.  Pain/Pressure: Absent     Pelvic: Cervical exam deferred        Extremities: Normal range of motion.  Edema: None  Mental Status: Normal mood and affect. Normal behavior. Normal judgment and thought content.   Assessment and Plan:  Pregnancy: G4P1021 at [redacted]w[redacted]d  1. COVID-19 affecting pregnancy in second trimester Not hospitalized  2. Chlamydia infection affecting  pregnancy in second trimester TOC today, self collected  3. Supervision of high risk pregnancy, antepartum Reviewed pregnancy care Recommended pregnancy support band WBC at last check was 12.6 which is WNL for pregnancy. She will have another CBC at 28 wks as part of routine care.  Next appt for 3rd trimester labs  4. Pyelonephritis affecting pregnancy in first trimester Continues to take suppression Keflex  5. Late prenatal care UTD with care at this point  6. Prior pregnancy complicated by IUGR, antepartum Baby was 8#2 at birth   Preterm labor symptoms and general obstetric precautions including but not limited to vaginal bleeding, contractions, leaking of fluid and fetal movement were reviewed in detail with the patient. Please refer to After Visit Summary for other counseling recommendations.  Return in about 2 weeks (around 04/12/2020) for Routine prenatal care, in person, 28 wk labs.  Future Appointments  Date Time Provider [place]  05/11/2020  9:00 AM [facility]    Caren Macadam, MD

## 2020-03-29 NOTE — Progress Notes (Signed)
Patient here for MH RV at  56 2/7. S/S PTL reviewed, literature. Needs 3 week TOC for Chlamydia. CBC today?Jenetta Downer, RN

## 2020-03-29 NOTE — Progress Notes (Signed)
Patient given information regarding circumcision today.Jenetta Downer, RN

## 2020-03-30 ENCOUNTER — Telehealth: Payer: Self-pay | Admitting: Obstetrics and Gynecology

## 2020-03-30 DIAGNOSIS — Z419 Encounter for procedure for purposes other than remedying health state, unspecified: Secondary | ICD-10-CM | POA: Diagnosis not present

## 2020-03-30 NOTE — Telephone Encounter (Signed)
Betty Allen called back to get the lab results.  Confirmed that she knows gender, which is female.  She had no further questions.  Wilburt Finlay, MS, CGC

## 2020-03-30 NOTE — Telephone Encounter (Signed)
We have been unable to reach Ms. Loni Dolly by phone with the following lab results: The recent MaterniT21 testing yielded NEGATIVE results.  The patient's specimen showed DNA consistent with two copies of chromosomes 21, 18 and 13.  The sensitivity for trisomy 54, trisomy 72 and trisomy 57 using this testing are reported as 99.1%, 99.9% and 91.7% respectively.  Thus, while the results of this testing are highly accurate, they are not considered diagnostic at this time.  Should more definitive information be desired, the patient may still consider amniocentesis.   As requested by the patient, sex chromosome analysis was included for this sample and was within normal limits. This is predicted with >99% accuracy. This testing also screens for sex chromosome conditions with greater than 96% accuracy and was negative for those conditions. If the patient would like to know the fetal gender, she can call our office or look at the results in [place].  A maternal serum AFP only was also ordered on 03/02/20 and was within normal limits.  This reduces the chance to have a baby with open spina bifida to 1 in 3,501 in this pregnancy.  We may be reached at [phone] with any questions or concerns.   Wilburt Finlay, MS, CGC

## 2020-03-31 LAB — CHLAMYDIA/GC NAA, CONFIRMATION
Chlamydia trachomatis, NAA: NEGATIVE
Neisseria gonorrhoeae, NAA: NEGATIVE

## 2020-04-12 ENCOUNTER — Ambulatory Visit: Payer: Medicaid Other | Admitting: Student

## 2020-04-12 ENCOUNTER — Other Ambulatory Visit: Payer: Self-pay

## 2020-04-12 DIAGNOSIS — U071 COVID-19: Secondary | ICD-10-CM

## 2020-04-12 DIAGNOSIS — O2301 Infections of kidney in pregnancy, first trimester: Secondary | ICD-10-CM

## 2020-04-12 DIAGNOSIS — O98512 Other viral diseases complicating pregnancy, second trimester: Secondary | ICD-10-CM

## 2020-04-12 DIAGNOSIS — O2302 Infections of kidney in pregnancy, second trimester: Secondary | ICD-10-CM

## 2020-04-12 DIAGNOSIS — Z23 Encounter for immunization: Secondary | ICD-10-CM | POA: Diagnosis not present

## 2020-04-12 DIAGNOSIS — A749 Chlamydial infection, unspecified: Secondary | ICD-10-CM

## 2020-04-12 DIAGNOSIS — D250 Submucous leiomyoma of uterus: Secondary | ICD-10-CM

## 2020-04-12 DIAGNOSIS — O0990 Supervision of high risk pregnancy, unspecified, unspecified trimester: Secondary | ICD-10-CM | POA: Diagnosis not present

## 2020-04-12 DIAGNOSIS — O0992 Supervision of high risk pregnancy, unspecified, second trimester: Secondary | ICD-10-CM

## 2020-04-12 DIAGNOSIS — O98812 Other maternal infectious and parasitic diseases complicating pregnancy, second trimester: Secondary | ICD-10-CM

## 2020-04-12 LAB — HEMOGLOBIN, FINGERSTICK: Hemoglobin: 11.6 g/dL (ref 11.1–15.9)

## 2020-04-12 NOTE — Progress Notes (Signed)
   PRENATAL VISIT NOTE  Subjective:  Betty Allen is a 20 y.o. G4P1021 at [redacted]w[redacted]d being seen today for ongoing prenatal care.  She is currently monitored for the following issues for this high-risk pregnancy and has Fibroids, submucosal; Supervision of high risk pregnancy, antepartum; High risk teen pregnancy, antepartum; Pyelonephritis affecting pregnancy in first trimester; Late prenatal care; Prior pregnancy complicated by IUGR, antepartum; Chlamydia infection affecting pregnancy in second trimester; and COVID-19 affecting pregnancy in second trimester 02/05/20 on their problem list.  Patient reports no complaints.  Contractions: Not present. Vag. Bleeding: None.  Movement: Present. Denies leaking of fluid/ROM.   The following portions of the patient's history were reviewed and updated as appropriate: allergies, current medications, past family history, past medical history, past social history, past surgical history and problem list. Problem list updated.  Objective:  There were no vitals filed for this visit.  Fetal Status: Fetal Heart Rate (bpm): 140 Fundal Height: 28 cm Movement: Present     General:  Alert, oriented and cooperative. Patient is in no acute distress.  Skin: Skin is warm and dry. No rash noted.   Cardiovascular: Normal heart rate noted  Respiratory: Normal respiratory effort, no problems with respiration noted  Abdomen: Soft, gravid, appropriate for gestational age.  Pain/Pressure: Absent     Pelvic: Cervical exam deferred        Extremities: Normal range of motion.  Edema: None  Mental Status: Normal mood and affect. Normal behavior. Normal judgment and thought content.   Assessment and Plan:  Pregnancy: G4P1021 at [redacted]w[redacted]d  1. Chlamydia infection affecting pregnancy in second trimester Next TOC 05/2020  2. Pyelonephritis affecting pregnancy in first trimester Hospitalized 02/05/20 ; TOC 02/23/20 neg; on Keflex suppression 500 mg daily; needs C&S q trimester  3.  COVID-19 affecting pregnancy in second trimester 02/05/20   4. Fibroids, submucosal   5. Supervision of high risk pregnancy, antepartum Cell free DNA screen neg Reviewed 03/02/20 u/s with anterior placenta, EFW=25%, normal sub suboptimal view of anatomy; AFI wnl,  Needs f/u anatomy in 4 wks and growth u/s at 32 wks.  Next u/s 05/11/20 Wants DMPA pp Working 24 hrs/wk at [employer] 1 hour glucola today - Hemoglobin, fingerstick - Glucose, 1 hour gestational - HIV-1/HIV-2 Qualitative RNA - RPR - CBC   Preterm labor symptoms and general obstetric precautions including but not limited to vaginal bleeding, contractions, leaking of fluid and fetal movement were reviewed in detail with the patient. Please refer to After Visit Summary for other counseling recommendations.  No follow-ups on file.  Future Appointments  Date Time Provider [place]  05/11/2020  9:00 AM [facility] [place], CNM

## 2020-04-13 LAB — GLUCOSE, 1 HOUR GESTATIONAL: Gestational Diabetes Screen: 89 mg/dL (ref 65–139)

## 2020-04-13 LAB — CBC
Hematocrit: 34.1 % (ref 34.0–46.6)
Hemoglobin: 11.7 g/dL (ref 11.1–15.9)
MCH: 30.1 pg (ref 26.6–33.0)
MCHC: 34.3 g/dL (ref 31.5–35.7)
MCV: 88 fL (ref 79–97)
Platelets: 174 10*3/uL (ref 150–450)
RBC: 3.89 x10E6/uL (ref 3.77–5.28)
RDW: 12.7 % (ref 11.7–15.4)
WBC: 11.1 10*3/uL — ABNORMAL HIGH (ref 3.4–10.8)

## 2020-04-13 LAB — RPR: RPR Ser Ql: NONREACTIVE

## 2020-04-14 LAB — HIV-1/HIV-2 QUALITATIVE RNA
HIV-1 RNA, Qualitative: NONREACTIVE
HIV-2 RNA, Qualitative: NONREACTIVE

## 2020-04-26 ENCOUNTER — Other Ambulatory Visit: Payer: Self-pay

## 2020-04-26 ENCOUNTER — Ambulatory Visit: Payer: Medicaid Other | Admitting: Advanced Practice Midwife

## 2020-04-26 VITALS — BP 108/65 | HR 105 | Temp 98.8°F | Wt 118.6 lb

## 2020-04-26 DIAGNOSIS — O2303 Infections of kidney in pregnancy, third trimester: Secondary | ICD-10-CM

## 2020-04-26 DIAGNOSIS — O0930 Supervision of pregnancy with insufficient antenatal care, unspecified trimester: Secondary | ICD-10-CM

## 2020-04-26 DIAGNOSIS — O0990 Supervision of high risk pregnancy, unspecified, unspecified trimester: Secondary | ICD-10-CM

## 2020-04-26 DIAGNOSIS — O98812 Other maternal infectious and parasitic diseases complicating pregnancy, second trimester: Secondary | ICD-10-CM

## 2020-04-26 DIAGNOSIS — O2301 Infections of kidney in pregnancy, first trimester: Secondary | ICD-10-CM

## 2020-04-26 DIAGNOSIS — O0933 Supervision of pregnancy with insufficient antenatal care, third trimester: Secondary | ICD-10-CM

## 2020-04-26 DIAGNOSIS — O09299 Supervision of pregnancy with other poor reproductive or obstetric history, unspecified trimester: Secondary | ICD-10-CM

## 2020-04-26 DIAGNOSIS — A749 Chlamydial infection, unspecified: Secondary | ICD-10-CM

## 2020-04-26 DIAGNOSIS — O09293 Supervision of pregnancy with other poor reproductive or obstetric history, third trimester: Secondary | ICD-10-CM

## 2020-04-26 DIAGNOSIS — O0993 Supervision of high risk pregnancy, unspecified, third trimester: Secondary | ICD-10-CM

## 2020-04-26 DIAGNOSIS — O98813 Other maternal infectious and parasitic diseases complicating pregnancy, third trimester: Secondary | ICD-10-CM

## 2020-04-26 NOTE — Progress Notes (Signed)
   PRENATAL VISIT NOTE  Subjective:  Betty Allen is a 20 y.o. [id number] at [redacted]w[redacted]d being seen today for ongoing prenatal care.  She is currently monitored for the following issues for this high-risk pregnancy and has Fibroids, submucosal; Supervision of high risk pregnancy, antepartum; High risk teen pregnancy, antepartum; Pyelonephritis affecting pregnancy in first trimester; Late prenatal care at 20 wks; Prior pregnancy complicated by IUGR, antepartum; Chlamydia infection affecting pregnancy in second trimester; and COVID-19 affecting pregnancy in second trimester 02/05/20 on their problem list.  Patient reports c/o h/a past 2 days and just took 1 Tylenol last night, no breakfast today yet.  Contractions: Not present. Vag. Bleeding: None.  Movement: Present. Denies leaking of fluid/ROM.   The following portions of the patient's history were reviewed and updated as appropriate: allergies, current medications, past family history, past medical history, past social history, past surgical history and problem list. Problem list updated.  Objective:   Vitals:   04/26/20 0922  BP: 108/65  Pulse: (!) 105  Temp: 98.8 F (37.1 C)  Weight: 118 lb 9.6 oz (53.8 kg)    Fetal Status: Fetal Heart Rate (bpm): 150 Fundal Height: 29 cm Movement: Present     General:  Alert, oriented and cooperative. Patient is in no acute distress.  Skin: Skin is warm and dry. No rash noted.   Cardiovascular: Normal heart rate noted  Respiratory: Normal respiratory effort, no problems with respiration noted  Abdomen: Soft, gravid, appropriate for gestational age.  Pain/Pressure: Absent     Pelvic: Cervical exam deferred        Extremities: Normal range of motion.  Edema: None  Mental Status: Normal mood and affect. Normal behavior. Normal judgment and thought content.   Assessment and Plan:  Pregnancy: G4P1021 at [redacted]w[redacted]d  1. Prior pregnancy complicated by IUGR, antepartum Pt reminded of 32 wk growth u/s with MFM  on 05/11/20   2. Chlamydia infection affecting pregnancy in second trimester 3 wk TOC after tx 03/29/20 neg 3 mo retest after tx 06/05/20  3. Pyelonephritis affecting pregnancy in first trimester Hospitalized 02/05/20 and is on suppression of Keflex 500 mg daily with C&S q trimester Last C&S 02/23/20 neg C&S today - Urine Culture  4. Supervision of high risk pregnancy, antepartum 1 hour glucola=89 on 04/12/20 Pt c/o h/a past 2 days and took 1 Tylenol last night.  Has not eaten breakfast yet today (10 am).  Encouraged to take 2 ES Tylenol q 8 hours, increase fluids, eat protein snacks q 2 hours, go to ER if no relief 25 lb 9.6 oz (11.6 kg); 4 lb wt gain in past 4 wks Working 40 hours/wk  5. Late prenatal care at 20 wks    Preterm labor symptoms and general obstetric precautions including but not limited to vaginal bleeding, contractions, leaking of fluid and fetal movement were reviewed in detail with the patient. Please refer to After Visit Summary for other counseling recommendations.  No follow-ups on file.  Future Appointments  Date Time Provider [place]  05/10/2020  9:00 AM [facility] PROVIDER [facility] None  05/11/2020  9:00 AM [facility] [place], CNM

## 2020-04-26 NOTE — Progress Notes (Signed)
Aware of [facility] [place] 05/11/2020 at 0900. Rich Number, RN

## 2020-04-27 ENCOUNTER — Other Ambulatory Visit: Payer: Self-pay | Admitting: Family Medicine

## 2020-04-27 DIAGNOSIS — Z8759 Personal history of other complications of pregnancy, childbirth and the puerperium: Secondary | ICD-10-CM

## 2020-04-27 DIAGNOSIS — O09892 Supervision of other high risk pregnancies, second trimester: Secondary | ICD-10-CM

## 2020-04-28 LAB — URINE CULTURE

## 2020-04-30 DIAGNOSIS — Z419 Encounter for procedure for purposes other than remedying health state, unspecified: Secondary | ICD-10-CM | POA: Diagnosis not present

## 2020-05-10 ENCOUNTER — Ambulatory Visit: Payer: Medicaid Other

## 2020-05-11 ENCOUNTER — Ambulatory Visit: Payer: Medicaid Other | Attending: Maternal & Fetal Medicine

## 2020-05-11 ENCOUNTER — Other Ambulatory Visit: Payer: Self-pay

## 2020-05-11 ENCOUNTER — Other Ambulatory Visit: Payer: Self-pay | Admitting: Family Medicine

## 2020-05-11 DIAGNOSIS — O09892 Supervision of other high risk pregnancies, second trimester: Secondary | ICD-10-CM

## 2020-05-11 DIAGNOSIS — Z3A32 32 weeks gestation of pregnancy: Secondary | ICD-10-CM | POA: Diagnosis not present

## 2020-05-11 DIAGNOSIS — Z8759 Personal history of other complications of pregnancy, childbirth and the puerperium: Secondary | ICD-10-CM | POA: Diagnosis not present

## 2020-05-11 DIAGNOSIS — O09293 Supervision of pregnancy with other poor reproductive or obstetric history, third trimester: Secondary | ICD-10-CM | POA: Diagnosis not present

## 2020-05-11 DIAGNOSIS — O09893 Supervision of other high risk pregnancies, third trimester: Secondary | ICD-10-CM | POA: Diagnosis not present

## 2020-05-17 ENCOUNTER — Telehealth: Payer: Self-pay

## 2020-05-17 ENCOUNTER — Ambulatory Visit: Payer: Medicaid Other

## 2020-05-17 NOTE — Telephone Encounter (Signed)
Per Epic appt desk, client has rescheduled 05/17/2020 missed [facility] RV appt for 05/20/2020. Rich Number, RN

## 2020-05-17 NOTE — Telephone Encounter (Signed)
DNKA as scheduled 05/17/2020 in [facility]. Call to mobile number and answered by her brother who states will get message to her to call and reschedule appt. Brother states she is at work. Rich Number, RN

## 2020-05-20 ENCOUNTER — Ambulatory Visit: Payer: Medicaid Other | Admitting: Advanced Practice Midwife

## 2020-05-20 ENCOUNTER — Other Ambulatory Visit: Payer: Self-pay

## 2020-05-20 VITALS — BP 115/68 | HR 103 | Temp 97.5°F | Wt 125.4 lb

## 2020-05-20 DIAGNOSIS — O98813 Other maternal infectious and parasitic diseases complicating pregnancy, third trimester: Secondary | ICD-10-CM

## 2020-05-20 DIAGNOSIS — A749 Chlamydial infection, unspecified: Secondary | ICD-10-CM

## 2020-05-20 DIAGNOSIS — O09293 Supervision of pregnancy with other poor reproductive or obstetric history, third trimester: Secondary | ICD-10-CM

## 2020-05-20 DIAGNOSIS — O0993 Supervision of high risk pregnancy, unspecified, third trimester: Secondary | ICD-10-CM

## 2020-05-20 DIAGNOSIS — O2303 Infections of kidney in pregnancy, third trimester: Secondary | ICD-10-CM

## 2020-05-20 DIAGNOSIS — O0990 Supervision of high risk pregnancy, unspecified, unspecified trimester: Secondary | ICD-10-CM

## 2020-05-20 DIAGNOSIS — O09299 Supervision of pregnancy with other poor reproductive or obstetric history, unspecified trimester: Secondary | ICD-10-CM

## 2020-05-20 DIAGNOSIS — O2301 Infections of kidney in pregnancy, first trimester: Secondary | ICD-10-CM

## 2020-05-20 NOTE — Progress Notes (Signed)
Patient here for MH RV at 30 5/7. Kick counts reviewed and cards given.Jenetta Downer, RN

## 2020-05-20 NOTE — Progress Notes (Signed)
   PRENATAL VISIT NOTE  Subjective:  Betty Allen is a 20 y.o. [id number] at [redacted]w[redacted]d being seen today for ongoing prenatal care.  She is currently monitored for the following issues for this high-risk pregnancy and has Fibroids, submucosal; Supervision of high risk pregnancy, antepartum; High risk teen pregnancy, antepartum; Pyelonephritis affecting pregnancy in first trimester; Late prenatal care at 20 wks; Prior pregnancy complicated by IUGR, antepartum; Chlamydia infection affecting pregnancy in second trimester; and COVID-19 affecting pregnancy in second trimester 02/05/20 on their problem list.  Patient reports no complaints.   .  .   . Denies leaking of fluid/ROM.   The following portions of the patient's history were reviewed and updated as appropriate: allergies, current medications, past family history, past medical history, past social history, past surgical history and problem list. Problem list updated.  Objective:   Vitals:   05/20/20 1444  BP: 115/68  Pulse: (!) 103  Temp: (!) 97.5 F (36.4 C)  Weight: 125 lb 6.4 oz (56.9 kg)    Fetal Status:   Fundal Height: 33 cm       General:  Alert, oriented and cooperative. Patient is in no acute distress.  Skin: Skin is warm and dry. No rash noted.   Cardiovascular: Normal heart rate noted  Respiratory: Normal respiratory effort, no problems with respiration noted  Abdomen: Soft, gravid, appropriate for gestational age.        Pelvic: Cervical exam deferred        Extremities: Normal range of motion.  Edema: None  Mental Status: Normal mood and affect. Normal behavior. Normal judgment and thought content.   Assessment and Plan:  Pregnancy: G4P1021 at [redacted]w[redacted]d  1. 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 Pyelonephritis affecting pregnancy in first trimester Hospitalized 02/05/20 for pyelo Taking Keflex 500 mg daily for suppression Last C&S 04/26/20 neg (q trimester)  3. Chlamydia infection affecting pregnancy in second trimester Needs 3 mo Chlamydia test ~06/05/20 Pt states partner was treated  4. Prior pregnancy complicated by IUGR, antepartum 05/11/20 u/s with EFW=11%   Preterm labor symptoms and general obstetric precautions including but not limited to vaginal bleeding, contractions, leaking of fluid and fetal movement were reviewed in detail with the patient. Please refer to After Visit Summary for other counseling recommendations.  Return in about 2 weeks (around 06/03/2020) for routine PNC.  Future Appointments  Date Time Provider [place]  06/01/2020  4:00 PM [facility] [place], CNM

## 2020-05-24 ENCOUNTER — Other Ambulatory Visit: Payer: Self-pay | Admitting: Family Medicine

## 2020-05-24 DIAGNOSIS — O09293 Supervision of pregnancy with other poor reproductive or obstetric history, third trimester: Secondary | ICD-10-CM

## 2020-05-24 DIAGNOSIS — O09893 Supervision of other high risk pregnancies, third trimester: Secondary | ICD-10-CM

## 2020-05-24 DIAGNOSIS — O09299 Supervision of pregnancy with other poor reproductive or obstetric history, unspecified trimester: Secondary | ICD-10-CM

## 2020-05-30 DIAGNOSIS — Z419 Encounter for procedure for purposes other than remedying health state, unspecified: Secondary | ICD-10-CM | POA: Diagnosis not present

## 2020-06-01 ENCOUNTER — Other Ambulatory Visit: Payer: Self-pay

## 2020-06-01 ENCOUNTER — Ambulatory Visit: Payer: MEDICAID | Attending: Maternal & Fetal Medicine

## 2020-06-01 DIAGNOSIS — O09293 Supervision of pregnancy with other poor reproductive or obstetric history, third trimester: Secondary | ICD-10-CM

## 2020-06-01 DIAGNOSIS — O09893 Supervision of other high risk pregnancies, third trimester: Secondary | ICD-10-CM

## 2020-06-01 DIAGNOSIS — O09299 Supervision of pregnancy with other poor reproductive or obstetric history, unspecified trimester: Secondary | ICD-10-CM

## 2020-06-03 ENCOUNTER — Telehealth: Payer: Self-pay

## 2020-06-03 ENCOUNTER — Ambulatory Visit: Payer: Medicaid Other

## 2020-06-03 ENCOUNTER — Other Ambulatory Visit: Payer: Self-pay | Admitting: Family Medicine

## 2020-06-03 DIAGNOSIS — O09893 Supervision of other high risk pregnancies, third trimester: Secondary | ICD-10-CM

## 2020-06-03 NOTE — Telephone Encounter (Signed)
[facility] as scheduled in [facility] 06/03/2020. Call to client who states was unable to get off work for appt. Requested appt 06/08/2020 as day off. Appt scheduled for 06/08/2020 and client to arrive at 0800. Rich Number, RN

## 2020-06-03 NOTE — Progress Notes (Unsigned)
Appt scheduled on 06/07/20 in the Main Ultrasound for F/U Ultrasound for this pt.  She is aware of appt date, time and location since its not in [facility] that day.

## 2020-06-07 ENCOUNTER — Encounter: Payer: Self-pay | Admitting: Family Medicine

## 2020-06-07 ENCOUNTER — Ambulatory Visit: Payer: Medicaid Other | Attending: Family Medicine

## 2020-06-07 ENCOUNTER — Encounter: Payer: Self-pay | Admitting: Advanced Practice Midwife

## 2020-06-07 ENCOUNTER — Other Ambulatory Visit: Payer: Self-pay | Admitting: Family Medicine

## 2020-06-07 ENCOUNTER — Other Ambulatory Visit: Payer: Self-pay

## 2020-06-07 ENCOUNTER — Telehealth: Payer: Self-pay

## 2020-06-07 DIAGNOSIS — Z3A36 36 weeks gestation of pregnancy: Secondary | ICD-10-CM | POA: Diagnosis not present

## 2020-06-07 DIAGNOSIS — O09893 Supervision of other high risk pregnancies, third trimester: Secondary | ICD-10-CM | POA: Insufficient documentation

## 2020-06-07 DIAGNOSIS — O365930 Maternal care for other known or suspected poor fetal growth, third trimester, not applicable or unspecified: Secondary | ICD-10-CM | POA: Diagnosis not present

## 2020-06-07 NOTE — Progress Notes (Signed)
[id number] with at [redacted]w[redacted]d LMP of 09/27/2019, c/w 2nd trimester [place] 173w3d Scheduled for induction of labor for IUGR on 06/14/2020 at 0001.   Prenatal provider: [facility] Pregnancy complicated by: 1. IUGR - EFW 3% 2. Teen pregnancy  3. Late prenatal care at 20 weeks  4. Chlamydia infection in pregnancy  5. Covid-19 infection in pregnancy   Prenatal Labs: Blood type/Rh B pos   Antibody screen neg  Rubella MMR x 2  Varicella Immune - 2019  RPR NR  HBsAg Neg  HIV NR  GC neg  Chlamydia Pos 02/23/20 - neg TOC 03/29/20  Genetic screening negative  1 hour GTT 89  3 hour GTT N/A  GBS Pending    Tdap: given 04/12/2020 Flu: declined  Contraception: Depo Feeding preference: Breast   ____ AnDrinda ButtsCNM Certified Nurse Midwife [facility]

## 2020-06-07 NOTE — Telephone Encounter (Signed)
Received staff message from [place] from [place] this am stating they had arranged [place] for client at [facility] as they are closed today. Per Ms. Carlota Raspberry, Dr. Gertie Exon wants IOL in 5 days due to new [place] result of IUGR and requests this be arranged. Information provided to [place] and IOL referral x2 faxed to [facility] with records. Note written by RN on IOL referral form that all radiographic reports (of which there are many) are in Care Everywhere. Fax confirmation received. Call to [facility] and spoke with Janett Billow regarding above. Call made to client and left message that IOL was recommended at EGA = 37 weeks and [facility] working to get this scheduled for 04/12/2020. Also reminded client of [facility] RV appt tomorrow with arrival time of 0800. Rich Number, RN

## 2020-06-08 ENCOUNTER — Ambulatory Visit: Payer: Medicaid Other | Admitting: Advanced Practice Midwife

## 2020-06-08 VITALS — BP 113/71 | HR 104 | Temp 97.5°F | Wt 127.0 lb

## 2020-06-08 DIAGNOSIS — O98813 Other maternal infectious and parasitic diseases complicating pregnancy, third trimester: Secondary | ICD-10-CM

## 2020-06-08 DIAGNOSIS — O0990 Supervision of high risk pregnancy, unspecified, unspecified trimester: Secondary | ICD-10-CM

## 2020-06-08 DIAGNOSIS — A749 Chlamydial infection, unspecified: Secondary | ICD-10-CM

## 2020-06-08 DIAGNOSIS — O2303 Infections of kidney in pregnancy, third trimester: Secondary | ICD-10-CM

## 2020-06-08 DIAGNOSIS — O09899 Supervision of other high risk pregnancies, unspecified trimester: Secondary | ICD-10-CM

## 2020-06-08 DIAGNOSIS — O2301 Infections of kidney in pregnancy, first trimester: Secondary | ICD-10-CM

## 2020-06-08 DIAGNOSIS — O365990 Maternal care for other known or suspected poor fetal growth, unspecified trimester, not applicable or unspecified: Secondary | ICD-10-CM

## 2020-06-08 DIAGNOSIS — O0993 Supervision of high risk pregnancy, unspecified, third trimester: Secondary | ICD-10-CM

## 2020-06-08 DIAGNOSIS — O98812 Other maternal infectious and parasitic diseases complicating pregnancy, second trimester: Secondary | ICD-10-CM

## 2020-06-08 LAB — OB RESULTS CONSOLE GC/CHLAMYDIA
Chlamydia: NEGATIVE
Gonorrhea: NEGATIVE

## 2020-06-08 LAB — OB RESULTS CONSOLE GBS: GBS: NEGATIVE

## 2020-06-08 NOTE — Progress Notes (Signed)
Pt denies visits to ER since last appt with [facility]. Pt taking PNV and Keflex. Discussed 36 weeks pack and provided to pt. Pt completed Abuse screen and PHQ9. Pt will self collect 36 week labs (has PCN allergy).

## 2020-06-08 NOTE — Progress Notes (Signed)
   PRENATAL VISIT NOTE  Subjective:  Betty Allen is a 20 y.o. [id number] at [redacted]w[redacted]d being seen today for ongoing prenatal care.  She is currently monitored for the following issues for this high-risk pregnancy and has Fibroids, submucosal; Supervision of high risk pregnancy, antepartum; High risk teen pregnancy, antepartum; Pyelonephritis affecting pregnancy in first trimester; Late prenatal care at 20 wks; IUGR (intrauterine growth restriction) affecting care of mother; Chlamydia infection affecting pregnancy in second trimester; and COVID-19 affecting pregnancy in second trimester 02/05/20 on their problem list.  Patient reports no complaints.  Contractions: Not present. Vag. Bleeding: None.  Movement: Present. Denies leaking of fluid/ROM.   The following portions of the patient's history were reviewed and updated as appropriate: allergies, current medications, past family history, past medical history, past social history, past surgical history and problem list. Problem list updated.  Objective:   Vitals:   06/08/20 0832  BP: 113/71  Pulse: (!) 104  Temp: (!) 97.5 F (36.4 C)  Weight: 127 lb (57.6 kg)    Fetal Status: Fetal Heart Rate (bpm): 140 Fundal Height: 36 cm Movement: Present  Presentation: Vertex  General:  Alert, oriented and cooperative. Patient is in no acute distress.  Skin: Skin is warm and dry. No rash noted.   Cardiovascular: Normal heart rate noted  Respiratory: Normal respiratory effort, no problems with respiration noted  Abdomen: Soft, gravid, appropriate for gestational age.  Pain/Pressure: Absent     Pelvic: Cervical exam deferred        Extremities: Normal range of motion.  Edema: None  Mental Status: Normal mood and affect. Normal behavior. Normal judgment and thought content.   Assessment and Plan:  Pregnancy: G4P1021 at [redacted]w[redacted]d  1. Chlamydia infection affecting pregnancy in second trimester 3 mo test today  2. Poor fetal growth affecting management of  mother, antepartum, single or unspecified fetus IUGR diagnosed on 06/07/20 u/s with EFW=3.2% (4#14), AFI wnl, anterior placenta, vtx, BPP 8/8 at 34.0 wks with normal interval growth, UA Dopplers wnl (EDC=07/03/20).  IOL scheduled for 0001 on 06/14/20 Knows when to go to L&D and to do daily kick counts and go to L&D if <10 fetal movements/day or change in fetal pattern. Counseled on increased protein snacks  3. Supervision of high risk pregnancy, antepartum Last day of work was yesterday. Feels well Knows when to go to L&D. Has car seat and ready for baby at home. DMPA pp for birth control.  Last appt here was 05/20/20 GC/Chlamydia/GBS self collected today - Chlamydia/GC NAA, Confirmation - GBS Culture with reflex  4. High risk teen pregnancy, antepartum   5. Pyelonephritis affecting pregnancy in first trimester Taking Keflex qhs prophylactically   Preterm labor symptoms and general obstetric precautions including but not limited to vaginal bleeding, contractions, leaking of fluid and fetal movement were reviewed in detail with the patient. Please refer to After Visit Summary for other counseling recommendations.  No follow-ups on file.  No future appointments.  Herbie Saxon, CNM

## 2020-06-10 ENCOUNTER — Telehealth: Payer: Self-pay

## 2020-06-10 LAB — STREP GP B NAA+RFLX: Strep Gp B NAA+Rflx: NEGATIVE

## 2020-06-10 LAB — CHLAMYDIA/GC NAA, CONFIRMATION
Chlamydia trachomatis, NAA: NEGATIVE
Neisseria gonorrhoeae, NAA: NEGATIVE

## 2020-06-10 NOTE — Telephone Encounter (Signed)
Call to client to verify awareness of need for Covid testing 06/11/2020 between 0800 - 0930 in preadmit testing at the [facility] and IOL appt of 06/14/2020 at 0001 (arrive few minutes before midnight on 06/13/2020 to ED for transport to      L & D). Per client, aware of above. Rich Number, RN

## 2020-06-11 ENCOUNTER — Other Ambulatory Visit: Payer: Self-pay

## 2020-06-11 ENCOUNTER — Other Ambulatory Visit
Admission: RE | Admit: 2020-06-11 | Discharge: 2020-06-11 | Disposition: A | Discharge status: Home / Self Care | Payer: Medicaid Other | Source: Ambulatory Visit | Attending: Advanced Practice Midwife | Admitting: Advanced Practice Midwife

## 2020-06-11 DIAGNOSIS — Z20822 Contact with and (suspected) exposure to covid-19: Secondary | ICD-10-CM | POA: Diagnosis not present

## 2020-06-11 DIAGNOSIS — Z01812 Encounter for preprocedural laboratory examination: Secondary | ICD-10-CM | POA: Diagnosis present

## 2020-06-11 LAB — SARS CORONAVIRUS 2 (TAT 6-24 HRS): SARS Coronavirus 2: NEGATIVE

## 2020-06-14 ENCOUNTER — Encounter: Payer: Self-pay | Admitting: Obstetrics and Gynecology

## 2020-06-14 ENCOUNTER — Other Ambulatory Visit: Payer: Self-pay

## 2020-06-14 ENCOUNTER — Inpatient Hospital Stay
Admission: EM | Admit: 2020-06-14 | Discharge: 2020-06-16 | DRG: 806 | Disposition: A | Discharge status: Home / Self Care | Payer: Medicaid Other | Attending: Obstetrics and Gynecology | Admitting: Obstetrics and Gynecology

## 2020-06-14 ENCOUNTER — Inpatient Hospital Stay: Payer: Medicaid Other | Admitting: Anesthesiology

## 2020-06-14 DIAGNOSIS — O9081 Anemia of the puerperium: Secondary | ICD-10-CM | POA: Diagnosis not present

## 2020-06-14 DIAGNOSIS — O365931 Maternal care for other known or suspected poor fetal growth, third trimester, fetus 1: Secondary | ICD-10-CM | POA: Diagnosis not present

## 2020-06-14 DIAGNOSIS — O2301 Infections of kidney in pregnancy, first trimester: Secondary | ICD-10-CM

## 2020-06-14 DIAGNOSIS — Z880 Allergy status to penicillin: Secondary | ICD-10-CM | POA: Diagnosis not present

## 2020-06-14 DIAGNOSIS — O0990 Supervision of high risk pregnancy, unspecified, unspecified trimester: Secondary | ICD-10-CM

## 2020-06-14 DIAGNOSIS — Z3A37 37 weeks gestation of pregnancy: Secondary | ICD-10-CM | POA: Diagnosis not present

## 2020-06-14 DIAGNOSIS — U071 COVID-19: Secondary | ICD-10-CM

## 2020-06-14 DIAGNOSIS — O0993 Supervision of high risk pregnancy, unspecified, third trimester: Secondary | ICD-10-CM | POA: Diagnosis not present

## 2020-06-14 DIAGNOSIS — Z20822 Contact with and (suspected) exposure to covid-19: Secondary | ICD-10-CM | POA: Diagnosis present

## 2020-06-14 DIAGNOSIS — O365930 Maternal care for other known or suspected poor fetal growth, third trimester, not applicable or unspecified: Principal | ICD-10-CM | POA: Diagnosis present

## 2020-06-14 DIAGNOSIS — O09899 Supervision of other high risk pregnancies, unspecified trimester: Secondary | ICD-10-CM

## 2020-06-14 DIAGNOSIS — O98512 Other viral diseases complicating pregnancy, second trimester: Principal | ICD-10-CM

## 2020-06-14 DIAGNOSIS — D62 Acute posthemorrhagic anemia: Secondary | ICD-10-CM | POA: Diagnosis not present

## 2020-06-14 DIAGNOSIS — O365990 Maternal care for other known or suspected poor fetal growth, unspecified trimester, not applicable or unspecified: Secondary | ICD-10-CM

## 2020-06-14 DIAGNOSIS — Z8616 Personal history of COVID-19: Secondary | ICD-10-CM | POA: Diagnosis not present

## 2020-06-14 DIAGNOSIS — O0930 Supervision of pregnancy with insufficient antenatal care, unspecified trimester: Secondary | ICD-10-CM

## 2020-06-14 DIAGNOSIS — O43123 Velamentous insertion of umbilical cord, third trimester: Secondary | ICD-10-CM | POA: Diagnosis present

## 2020-06-14 DIAGNOSIS — A749 Chlamydial infection, unspecified: Secondary | ICD-10-CM

## 2020-06-14 LAB — RPR: RPR Ser Ql: NONREACTIVE

## 2020-06-14 LAB — CBC
HCT: 32.7 % — ABNORMAL LOW (ref 36.0–46.0)
Hemoglobin: 10.8 g/dL — ABNORMAL LOW (ref 12.0–15.0)
MCH: 28.9 pg (ref 26.0–34.0)
MCHC: 33 g/dL (ref 30.0–36.0)
MCV: 87.4 fL (ref 80.0–100.0)
Platelets: 190 10*3/uL (ref 150–400)
RBC: 3.74 MIL/uL — ABNORMAL LOW (ref 3.87–5.11)
RDW: 12.9 % (ref 11.5–15.5)
WBC: 11.7 10*3/uL — ABNORMAL HIGH (ref 4.0–10.5)
nRBC: 0 % (ref 0.0–0.2)

## 2020-06-14 LAB — TYPE AND SCREEN
ABO/RH(D): B POS
Antibody Screen: NEGATIVE

## 2020-06-14 MED ORDER — BUPIVACAINE HCL (PF) 0.25 % IJ SOLN
INTRAMUSCULAR | Status: DC | PRN
  Administered 2020-06-14: 4 mL via EPIDURAL
  Administered 2020-06-14: 3 mL via EPIDURAL

## 2020-06-14 MED ORDER — OXYTOCIN-SODIUM CHLORIDE 30-0.9 UT/500ML-% IV SOLN
2.5000 [IU]/h | INTRAVENOUS | Status: DC
  Filled 2020-06-14: qty 500

## 2020-06-14 MED ORDER — SIMETHICONE 80 MG PO CHEW: 80.0000 mg | CHEWABLE_TABLET | ORAL | Status: DC | PRN

## 2020-06-14 MED ORDER — ZOLPIDEM TARTRATE 5 MG PO TABS: 5.0000 mg | ORAL_TABLET | Freq: Every evening | ORAL | Status: DC | PRN

## 2020-06-14 MED ORDER — ONDANSETRON HCL 4 MG/2ML IJ SOLN: 4.0000 mg | Freq: Four times a day (QID) | INTRAMUSCULAR | Status: DC | PRN

## 2020-06-14 MED ORDER — LIDOCAINE HCL (PF) 1 % IJ SOLN: 30.0000 mL | INTRAMUSCULAR | Status: DC | PRN

## 2020-06-14 MED ORDER — TERBUTALINE SULFATE 1 MG/ML IJ SOLN: 0.2500 mg | Freq: Once | INTRAMUSCULAR | Status: DC | PRN

## 2020-06-14 MED ORDER — ONDANSETRON HCL 4 MG PO TABS
4.0000 mg | ORAL_TABLET | ORAL | Status: DC | PRN
  Filled 2020-06-14: qty 1

## 2020-06-14 MED ORDER — LIDOCAINE-EPINEPHRINE (PF) 1.5 %-1:200000 IJ SOLN
INTRAMUSCULAR | Status: DC | PRN
  Administered 2020-06-14: 3 mL via PERINEURAL

## 2020-06-14 MED ORDER — FERROUS SULFATE 325 (65 FE) MG PO TABS
325.0000 mg | ORAL_TABLET | Freq: Two times a day (BID) | ORAL | Status: DC
  Administered 2020-06-14 – 2020-06-16 (×4): 325 mg via ORAL
  Filled 2020-06-14 (×4): qty 1

## 2020-06-14 MED ORDER — OXYTOCIN-SODIUM CHLORIDE 30-0.9 UT/500ML-% IV SOLN: 1.0000 m[IU]/min | INTRAVENOUS | Status: DC

## 2020-06-14 MED ORDER — LIDOCAINE HCL (PF) 1 % IJ SOLN
INTRAMUSCULAR | Status: AC
  Filled 2020-06-14: qty 30

## 2020-06-14 MED ORDER — MISOPROSTOL 200 MCG PO TABS
ORAL_TABLET | ORAL | Status: AC
  Filled 2020-06-14: qty 4

## 2020-06-14 MED ORDER — AMMONIA AROMATIC IN INHA
RESPIRATORY_TRACT | Status: AC
  Filled 2020-06-14: qty 10

## 2020-06-14 MED ORDER — SOD CITRATE-CITRIC ACID 500-334 MG/5ML PO SOLN: 30.0000 mL | ORAL | Status: DC | PRN

## 2020-06-14 MED ORDER — ACETAMINOPHEN 325 MG PO TABS
650.0000 mg | ORAL_TABLET | ORAL | Status: DC | PRN
  Administered 2020-06-14 – 2020-06-15 (×4): 650 mg via ORAL
  Filled 2020-06-14 (×5): qty 2

## 2020-06-14 MED ORDER — FENTANYL 2.5 MCG/ML W/ROPIVACAINE 0.15% IN NS 100 ML EPIDURAL (ARMC)
12.0000 mL/h | EPIDURAL | Status: DC
Start: 2020-06-14 — End: 2020-06-14
  Administered 2020-06-14 (×2): 12 mL/h via EPIDURAL
  Filled 2020-06-14: qty 100

## 2020-06-14 MED ORDER — OXYTOCIN 10 UNIT/ML IJ SOLN
INTRAMUSCULAR | Status: AC
  Filled 2020-06-14: qty 2

## 2020-06-14 MED ORDER — ONDANSETRON HCL 4 MG/2ML IJ SOLN: 4.0000 mg | INTRAMUSCULAR | Status: DC | PRN

## 2020-06-14 MED ORDER — WITCH HAZEL-GLYCERIN EX PADS: 1.0000 "application " | MEDICATED_PAD | CUTANEOUS | Status: DC | PRN

## 2020-06-14 MED ORDER — COCONUT OIL OIL
1.0000 "application " | TOPICAL_OIL | Status: DC | PRN
  Filled 2020-06-14 (×2): qty 120

## 2020-06-14 MED ORDER — EPHEDRINE 5 MG/ML INJ: 10.0000 mg | INTRAVENOUS | Status: DC | PRN

## 2020-06-14 MED ORDER — PHENYLEPHRINE 40 MCG/ML (10ML) SYRINGE FOR IV PUSH (FOR BLOOD PRESSURE SUPPORT): 80.0000 ug | PREFILLED_SYRINGE | INTRAVENOUS | Status: DC | PRN

## 2020-06-14 MED ORDER — CALCIUM CARBONATE ANTACID 500 MG PO CHEW: 400.0000 mg | CHEWABLE_TABLET | Freq: Three times a day (TID) | ORAL | Status: DC | PRN

## 2020-06-14 MED ORDER — BENZOCAINE-MENTHOL 20-0.5 % EX AERO
1.0000 "application " | INHALATION_SPRAY | CUTANEOUS | Status: DC | PRN
  Administered 2020-06-16: 1 via TOPICAL
  Filled 2020-06-14: qty 56

## 2020-06-14 MED ORDER — PRENATAL MULTIVITAMIN CH
1.0000 | ORAL_TABLET | Freq: Every day | ORAL | Status: DC
  Administered 2020-06-14 – 2020-06-15 (×2): 1 via ORAL
  Filled 2020-06-14 (×2): qty 1

## 2020-06-14 MED ORDER — DIBUCAINE (PERIANAL) 1 % EX OINT: 1.0000 "application " | TOPICAL_OINTMENT | CUTANEOUS | Status: DC | PRN

## 2020-06-14 MED ORDER — DIPHENHYDRAMINE HCL 25 MG PO CAPS: 25.0000 mg | ORAL_CAPSULE | Freq: Four times a day (QID) | ORAL | Status: DC | PRN

## 2020-06-14 MED ORDER — MISOPROSTOL 25 MCG QUARTER TABLET
25.0000 ug | ORAL_TABLET | ORAL | Status: DC | PRN
  Administered 2020-06-14 (×2): 25 ug via VAGINAL
  Filled 2020-06-14 (×2): qty 1

## 2020-06-14 MED ORDER — LACTATED RINGERS IV SOLN
500.0000 mL | INTRAVENOUS | Status: DC | PRN
  Administered 2020-06-14: 500 mL via INTRAVENOUS

## 2020-06-14 MED ORDER — IBUPROFEN 600 MG PO TABS
600.0000 mg | ORAL_TABLET | Freq: Four times a day (QID) | ORAL | Status: DC
  Administered 2020-06-14 – 2020-06-16 (×8): 600 mg via ORAL
  Filled 2020-06-14 (×9): qty 1

## 2020-06-14 MED ORDER — FENTANYL CITRATE (PF) 100 MCG/2ML IJ SOLN: 50.0000 ug | INTRAMUSCULAR | Status: DC | PRN

## 2020-06-14 MED ORDER — SODIUM CHLORIDE 0.9 % IV SOLN
INTRAVENOUS | Status: DC | PRN
  Administered 2020-06-14 (×3): 5 mL via EPIDURAL

## 2020-06-14 MED ORDER — SENNOSIDES-DOCUSATE SODIUM 8.6-50 MG PO TABS
2.0000 | ORAL_TABLET | Freq: Every day | ORAL | Status: DC
  Administered 2020-06-15 – 2020-06-16 (×2): 2 via ORAL
  Filled 2020-06-14 (×2): qty 2

## 2020-06-14 MED ORDER — DIPHENHYDRAMINE HCL 50 MG/ML IJ SOLN: 12.5000 mg | INTRAMUSCULAR | Status: DC | PRN

## 2020-06-14 MED ORDER — LACTATED RINGERS IV SOLN
500.0000 mL | Freq: Once | INTRAVENOUS | Status: AC
  Administered 2020-06-14: 500 mL via INTRAVENOUS

## 2020-06-14 MED ORDER — ACETAMINOPHEN 500 MG PO TABS: 1000.0000 mg | ORAL_TABLET | Freq: Four times a day (QID) | ORAL | Status: DC | PRN

## 2020-06-14 MED ORDER — LACTATED RINGERS IV SOLN: INTRAVENOUS | Status: DC

## 2020-06-14 MED ORDER — OXYTOCIN BOLUS FROM INFUSION
333.0000 mL | Freq: Once | INTRAVENOUS | Status: AC
  Administered 2020-06-14: 333 mL via INTRAVENOUS

## 2020-06-14 MED ORDER — LIDOCAINE HCL (PF) 1 % IJ SOLN
INTRAMUSCULAR | Status: DC | PRN
  Administered 2020-06-14 (×3): 3 mL

## 2020-06-14 NOTE — Discharge Summary (Signed)
Obstetrical Discharge Summary  Patient Name: Betty Allen DOB: 29-Aug-2000 MRN: [id number]  Date of Admission: 06/14/2020 Date of Delivery: 06/14/20 Delivered by: Hassan Buckler CNM Date of Discharge: 06/16/2020  Primary OB: [facility]  AGT:XMIWOEH'O last menstrual period was 09/27/2019 (exact date). EDC Estimated Date of Delivery: 07/03/20 Gestational Age at Delivery: [redacted]w[redacted]d  Antepartum complications:  1. IUGR - EFW 3% 2. Teen pregnancy  3. Late prenatal care at 20 weeks  4. Chlamydia infection in pregnancy 01/2020, Neg TOC 03/2020 and 05/2020  5. Covid-19 infection in pregnancy 01/2020  Admitting Diagnosis: IUGR, 37wks Secondary Diagnosis: SVD  Patient Active Problem List   Diagnosis Date Noted  . IUGR (intrauterine growth restriction) affecting care of mother, third trimester, fetus 1 06/14/2020  . COVID-19 affecting pregnancy in second trimester 02/05/20 03/02/2020  . Chlamydia infection affecting pregnancy in second trimester 02/27/2020  . Supervision of high risk pregnancy, antepartum 02/23/2020  . High risk teen pregnancy, antepartum 02/23/2020  . Pyelonephritis affecting pregnancy in first trimester 02/23/2020  . Late prenatal care at 20 wks 02/23/2020  . IUGR (intrauterine growth restriction) affecting care of mother 02/23/2020  . Fibroids, submucosal 12/06/2017    Augmentation: Cytotec Complications: None Intrapartum complications/course: see delivery note Date of Delivery: 06/14/20 Delivered By: RHassan BucklerCNM Delivery Type: spontaneous vaginal delivery Anesthesia: epidural Placenta: spontaneous, velamentous insertion Laceration: none Episiotomy: none Newborn Data: Live born female  Birth Weight:  5#14 APGAR: 8, 10  Newborn Delivery   Birth date/time: 06/14/2020 11:10:00 Delivery type: Vaginal, Spontaneous      Postpartum Procedures: None  Edinburgh:  Edinburgh Postnatal Depression Scale Screening Tool 06/14/2020 02/05/2020  I have been able  to laugh and see the funny side of things. 0 (No Data)  I have looked forward with enjoyment to things. 0 -  I have blamed myself unnecessarily when things went wrong. 0 -  I have been anxious or worried for no good reason. 0 -  I have felt scared or panicky for no good reason. 0 -  Things have been getting on top of me. 0 -  I have been so unhappy that I have had difficulty sleeping. 0 -  I have felt sad or miserable. 0 -  I have been so unhappy that I have been crying. 0 -  The thought of harming myself has occurred to me. 0 -  Edinburgh Postnatal Depression Scale Total 0 -      Post partum course:  Patient had an uncomplicated postpartum course.  By time of discharge on PPD#2, her pain was controlled on oral pain medications; she had appropriate lochia and was ambulating, voiding without difficulty and tolerating regular diet.  She was deemed stable for discharge to home.     Discharge Physical Exam:  BP 125/76   Pulse 93   Temp 98.3 F (36.8 C) (Oral)   Resp 18   Ht '5\' 2"'  (1.575 m)   Wt 57.6 kg   LMP 09/27/2019 (Exact Date)   SpO2 98%   Breastfeeding Unknown   BMI 23.23 kg/m   General: NAD CV: RRR Pulm: CTABL, nl effort ABD: s/nd/nt, fundus firm and below the umbilicus Lochia: moderate Perineum: well approximated/intact DVT Evaluation: LE non-ttp, no evidence of DVT on exam.  Hemoglobin  Date Value Ref Range Status  06/15/2020 10.4 (L) 12.0 - 15.0 g/dL Final  04/12/2020 11.7 11.1 - 15.9 g/dL Final   HCT  Date Value Ref Range Status  06/15/2020 30.9 (L) 36.0 - 46.0 %  Final   Hematocrit  Date Value Ref Range Status  04/12/2020 34.1 34.0 - 46.6 % Final     Disposition: stable, discharge to home. Baby Feeding: breastmilk Baby Disposition: home with mom  Rh Immune globulin given: N/A Rubella vaccine given: n/A Varicella vaccine given: N/A Tdap vaccine given in AP or PP setting: AP 04/12/20 Flu vaccine given in AP or PP setting: declined  Contraception:  Depo - plan to start 4-6 weeks postpartum d/t breastfeeding   Prenatal Labs:  Blood type/Rh B pos  Antibody screen neg  Rubella MMR x 2  Varicella Immune - 2019  RPR NR  HBsAg Neg  HIV NR  GC neg  Chlamydia Pos 02/23/20 - neg TOC 03/29/20  Genetic screening negative  1 hour GTT 89  3 hour GTT N/A  GBS  neg      Plan:  Betty Allen was discharged to home in good condition. Follow-up appointment with delivering provider in 6 weeks.  Discharge Medications: Allergies as of 06/16/2020      Reactions   Penicillins Rash   Has patient had a PCN reaction causing immediate rash, facial/tongue/throat swelling, SOB or lightheadedness with hypotension: Yes Has patient had a PCN reaction causing severe rash involving mucus membranes or skin necrosis: No Has patient had a PCN reaction that required hospitalization No Has patient had a PCN reaction occurring within the last 10 years: No If all of the above answers are "NO", then may proceed with Cephalosporin use.      Medication List    TAKE these medications   acetaminophen 325 MG tablet Commonly known as: Tylenol Take 2 tablets (650 mg total) by mouth every 4 (four) hours as needed (for pain scale < 4).   ferrous sulfate 325 (65 FE) MG tablet Take 1 tablet (325 mg total) by mouth 2 (two) times daily with a meal.   ibuprofen 600 MG tablet Commonly known as: ADVIL Take 1 tablet (600 mg total) by mouth every 6 (six) hours as needed for headache or moderate pain.   prenatal multivitamin Tabs tablet Take 1 tablet by mouth daily at 12 noon.        Follow-up Information    McVey, Murray Hodgkins, CNM. Schedule an appointment as soon as possible for a visit in 6 week(s).   Specialty: Obstetrics and Gynecology Why: POSTPARTUM APPT Contact information: [address] [place] [address] [phone]               Signed:  Terance Ice 06/16/2020  6:57 AM  Drinda Butts, CNM Certified Nurse  Midwife [facility]

## 2020-06-14 NOTE — Anesthesia Procedure Notes (Signed)
Epidural Patient location during procedure: OB Start time: 06/14/2020 9:00 AM End time: 06/14/2020 9:24 AM  Staffing Anesthesiologist: Emmie Niemann, MD Performed: anesthesiologist   Preanesthetic Checklist Completed: patient identified, IV checked, site marked, risks and benefits discussed, surgical consent, monitors and equipment checked, pre-op evaluation and timeout performed  Epidural Patient position: sitting Prep: ChloraPrep Patient monitoring: heart rate, continuous pulse ox and blood pressure Approach: midline Location: L3-L4 Injection technique: LOR saline  Needle:  Needle type: Tuohy  Needle gauge: 18 G Needle length: 9 cm and 9 Needle insertion depth: 6 cm Catheter type: closed end flexible Catheter size: 20 Guage Catheter at skin depth: 10 cm Test dose: negative (0.125% bupivacaine)  Assessment Events: blood aspirated, injection not painful, no injection resistance, no paresthesia and negative IV test  Additional Notes Initially had blood in catheter at end of threading, pulled back to 4cm in epidural space, flushed catheter and aspiration was negative.  Patient tolerated the insertion well without complications.Reason for block:procedure for pain

## 2020-06-14 NOTE — Anesthesia Preprocedure Evaluation (Addendum)
Anesthesia Evaluation  Patient identified by MRN, date of birth, ID band Patient awake    Reviewed: Allergy & Precautions, H&P , NPO status , Patient's Chart, lab work & pertinent test results  Airway Mallampati: II  TM Distance: >3 FB Neck ROM: full    Dental no notable dental hx.    Pulmonary neg pulmonary ROS,    breath sounds clear to auscultation- rhonchi (-) wheezing      Cardiovascular negative cardio ROS   Rhythm:Regular Rate:Normal - Systolic murmurs and - Diastolic murmurs    Neuro/Psych negative neurological ROS  negative psych ROS   GI/Hepatic Neg liver ROS, GERD  ,  Endo/Other  negative endocrine ROS  Renal/GU negative Renal ROS  negative genitourinary   Musculoskeletal   Abdominal (+) - obese,   Peds  Hematology  (+) Blood dyscrasia, anemia ,   Anesthesia Other Findings   Reproductive/Obstetrics (+) Pregnancy                            Lab Results  Component Value Date   WBC 11.7 (H) 06/14/2020   HGB 10.8 (L) 06/14/2020   HCT 32.7 (L) 06/14/2020   MCV 87.4 06/14/2020   PLT 190 06/14/2020    Anesthesia Physical Anesthesia Plan  ASA: II  Anesthesia Plan: Epidural   Post-op Pain Management:    Induction:   PONV Risk Score and Plan: 2 and Treatment may vary due to age or medical condition  Airway Management Planned:   Additional Equipment:   Intra-op Plan:   Post-operative Plan:   Informed Consent: I have reviewed the patients History and Physical, chart, labs and discussed the procedure including the risks, benefits and alternatives for the proposed anesthesia with the patient or authorized representative who has indicated his/her understanding and acceptance.       Plan Discussed with: Anesthesiologist and CRNA  Anesthesia Plan Comments: (Plan for epidural for labor, discussed epidural vs spinal vs GA if need for csection)       Anesthesia Quick  Evaluation

## 2020-06-14 NOTE — H&P (Signed)
OB History & Physical   History of Present Illness:  Chief Complaint: scheduled induction  HPI:  Betty Allen is a 20 y.o. G63P1021 female at 21w2ddated by LMP 09/27/19 and c/w [place] 186w3d She presents to L&D for scheduled iOL due to IUGR.   - reports fetal movement, denies LOF. Having painful UCs for last hour, requesting epidural.     Pregnancy Issues: 1. IUGR - EFW 3% 2. Teen pregnancy  3. Late prenatal care at 20 weeks  4. Chlamydia infection in pregnancy 01/2020, Neg TOC 03/2020 and 05/2020  5. Covid-19 infection in pregnancy  01/2020    Maternal Medical History:   Past Medical History:  Diagnosis Date  . Anemia   . Chronic kidney disease   . Fibroid   . History of chlamydia   . History of gonorrhea   . History of prior pregnancy with IUGR newborn   . History of pyelonephritis   . History of spontaneous abortion    x1    Past Surgical History:  Procedure Laterality Date  . denies surgical history      Allergies  Allergen Reactions  . Penicillins Rash    Has patient had a PCN reaction causing immediate rash, facial/tongue/throat swelling, SOB or lightheadedness with hypotension: Yes Has patient had a PCN reaction causing severe rash involving mucus membranes or skin necrosis: No Has patient had a PCN reaction that required hospitalization No Has patient had a PCN reaction occurring within the last 10 years: No If all of the above answers are "NO", then may proceed with Cephalosporin use.     Prior to Admission medications   Medication Sig Start Date End Date Taking? Authorizing Provider  Prenatal Vit-Fe Fumarate-FA (PRENATAL MULTIVITAMIN) TABS tablet Take 1 tablet by mouth daily at 12 noon.   Yes [provider]  cephALEXin (KEFLEX) 500 MG capsule Take 1 capsule (500 mg total) by mouth daily at 8 pm. For daily prophylaxis for remainder of pregnancy. Patient not taking: Reported on 06/14/2020 02/06/20 11/02/20  Astryd Pearcy, ReMurray HodgkinsCNM     [facility]   Social History: She  reports that she has never smoked. She has never used smokeless tobacco. She reports previous drug use. Drug: Marijuana. She reports that she does not drink alcohol.  Family History: family history includes Hypertension in her father, maternal grandmother, paternal grandfather, and paternal grandmother.   Review of Systems: A full review of systems was performed and negative except as noted in the HPI.     Physical Exam:  Vital Signs: BP 113/67   Pulse 89   Temp 97.9 F (36.6 C) (Oral)   Resp 16   Ht '5\' 2"'  (1.575 m)   Wt 57.6 kg   LMP 09/27/2019 (Exact Date)   BMI 23.23 kg/m  General: no acute distress.  HEENT: normocephalic, atraumatic Heart: regular rate & rhythm.  No murmurs/rubs/gallops Lungs: clear to auscultation bilaterally, normal respiratory effort Abdomen: soft, gravid, non-tender;  EFW: 5# Pelvic: deferred   Extremities: non-tender, symmetric, no edema bilaterally.  DTRs: 2+  Neurologic: Alert & oriented x 3.    Results for orders placed or performed during the hospital encounter of 06/14/20 (from the past 24 hour(s))  CBC     Status: Abnormal   Collection Time: 06/14/20 12:42 AM  Result Value Ref Range   WBC 11.7 (H) 4.0 - 10.5 K/uL   RBC 3.74 (L) 3.87 - 5.11 MIL/uL   Hemoglobin 10.8 (  L) 12.0 - 15.0 g/dL   HCT 32.7 (L) 36.0 - 46.0 %   MCV 87.4 80.0 - 100.0 fL   MCH 28.9 26.0 - 34.0 pg   MCHC 33.0 30.0 - 36.0 g/dL   RDW 12.9 11.5 - 15.5 %   Platelets 190 150 - 400 K/uL   nRBC 0.0 0.0 - 0.2 %  Type and screen     Status: None   Collection Time: 06/14/20 12:42 AM  Result Value Ref Range   ABO/RH(D) B POS    Antibody Screen NEG    Sample Expiration      06/17/2020,2359 Performed at [facility], [address], [place], [place] [address]    06/07/20: MFM US/Doppler/NST Impression:   Follow up growth due to history of FGR.  Normal interval growth with measurements consistent with   FGR with EFW 3.2%.  Good fetal movement and amniotic fluid volume  The UA Dopplers are normal without evidence of AEDF or  REDF.  Biophysical proflile 8/8  I discussed today's visit with a diagnosis of IUGR. I explained  that the etiology includes placental insufficiency, chronic  disease, infection, aneuploidy and other genetic syndromes.  She has no additional risk factors for chronic disease.  At this time I explained the diagnosis, evaluation and  management to include on going fetal growth and weekly  antenatal testing to include UA Dopplers.Given that the EFW  is +/ < 3rd%.   I recommend delivery at 37 weeks  Pertinent Results:  Prenatal Labs: Blood type/Rh B pos   Antibody screen neg  Rubella MMR x 2  Varicella Immune - 2019  RPR NR  HBsAg Neg  HIV NR  GC neg  Chlamydia Pos 02/23/20 - neg TOC 03/29/20  Genetic screening negative  1 hour GTT 89  3 hour GTT N/A  GBS  neg    FHT: 140bpm, mod variability, + accels, no decels TOCO: q1-7mn, palp mod.  SVE:  Dilation: 1.5 / Effacement (%): 60 / Station: -3    Cephalic by leopolds/exam   Assessment:  Betty ALLERTONis a 20y.o. GG7P1021female at 31w2dith IOL for IUGR.   Plan:  1. Admit to Labor & Delivery; consents reviewed and obtained - COVID Neg at pre-admit.   2. Fetal Well being  - Fetal Tracing: Cat I tracing - Group B Streptococcus ppx indicated: Neg - Presentation: cephalic confirmed by Leopolds/SVE per RN   3. Routine OB: - Prenatal labs reviewed, as above - Rh B Pos - CBC, T&S, RPR on admit - Clear fluids, IVF  4. Induction of Labor -  Contractions: external toco in place -  Pelvis proven to 3280g -  Plan for induction with Pitocin, AROM, s/p 2 doses of cytotec.  -  Plan for continuous fetal monitoring  -  Maternal pain control as desired - Anticipate vaginal delivery  5. Post Partum Planning: Tdap: given 04/12/2020 Flu: declined  Contraception: Depo Feeding preference: Breast    ReFrancetta FoundCNM 06/14/20 8:45 AM

## 2020-06-14 NOTE — Lactation Note (Signed)
This note was copied from a baby's chart. Lactation Consultation Note  Patient Name: Betty Allen GLOVF'I Date: 06/14/2020 Reason for consult: Follow-up assessment;Mother's request;Early term 37-38.6wks;Other (Comment) (Nipples are tender on initial latch, but no severe pain or trauma) Age:20 hours  Maternal Data Has patient been taught Hand Expression?: Yes Does the patient have breastfeeding experience prior to this delivery?: No (Did not breast feed first baby)  Feeding Mother's Current Feeding Choice: Breast Milk  When went in to see mom on M/B unit, [name] was licking out his tongue, turning his head and sucking on his fingers.  Mom reports having breast fed on the left breast less than an hour before for 30 minutes.  Mom shown how easily she can hand express colostrum to entice [name] to latch.  Mom willing to put him on the second side.  Mom has only breast fed on the left breast and in cradle hold.  Assisted mom with pillow support in comfortable position in football hold skin to skin.  [name] immediately latched and began strong, intermittent rhythmic sucking for 24 minutes.  Audible swallows pointed out to mom.  Mom reports tender nipples that eases up after he pulls the breast in and has a deep latch.  Explained transient nipple tenderness explaining it should only hurt at the beginning of the feeding and the discomfort will probably peak around the 3rd or 4th day and then recede and hopefully be gone by a week.  Coconut oil given and instructed in use.  Encouraged mom to call with any questions, concerns or if needed assistance. LATCH Score Latch: Grasps breast easily, tongue down, lips flanged, rhythmical sucking.  Audible Swallowing: A few with stimulation  Type of Nipple: Everted at rest and after stimulation  Comfort (Breast/Nipple): Filling, red/small blisters or bruises, mild/mod discomfort  Hold (Positioning): Assistance needed to correctly position infant at breast  and maintain latch.  LATCH Score: 7   Lactation Tools Discussed/Used Tools: Coconut oil  Interventions Interventions: Breast feeding basics reviewed;Assisted with latch;Skin to skin;Breast massage;Hand express;Breast compression;Adjust position;Support pillows;Position options;Coconut oil;Education  Discharge Discharge Education: Other (comment) WIC Program: Yes  Consult Status Consult Status: PRN Follow-up type: Call as needed    Jarold Motto 06/14/2020, 6:05 PM

## 2020-06-14 NOTE — Anesthesia Procedure Notes (Addendum)
Epidural Patient location during procedure: OB Start time: 06/14/2020 10:00 AM End time: 06/14/2020 10:05 AM  Staffing Anesthesiologist: Emmie Niemann, MD Resident/CRNA: Hedda Slade, CRNA Performed: resident/CRNA   Preanesthetic Checklist Completed: patient identified, IV checked, site marked, risks and benefits discussed, surgical consent, monitors and equipment checked, pre-op evaluation and timeout performed  Epidural Patient position: sitting Prep: ChloraPrep Patient monitoring: heart rate, continuous pulse ox and blood pressure Approach: midline Location: L3-L4 Injection technique: LOR saline  Needle:  Needle type: Tuohy  Needle gauge: 17 G Needle length: 9 cm and 9 Needle insertion depth: 5 cm Catheter type: closed end flexible Catheter size: 19 Gauge Catheter at skin depth: 11 cm Test dose: negative and 1.5% lidocaine with Epi 1:200 K  Assessment Sensory level: T10 Events: blood not aspirated, injection not painful, no injection resistance, no paresthesia and negative IV test  Additional Notes 1 attempt Pt. Evaluated and documentation done after procedure finished. Patient identified. Risks/Benefits/Options discussed with patient including but not limited to bleeding, infection, nerve damage, paralysis, failed block, incomplete pain control, headache, blood pressure changes, nausea, vomiting, reactions to medication both or allergic, itching and postpartum back pain. Confirmed with bedside nurse the patient's most recent platelet count. Confirmed with patient that they are not currently taking any anticoagulation, have any bleeding history or any family history of bleeding disorders. Patient expressed understanding and wished to proceed. All questions were answered. Sterile technique was used throughout the entire procedure. Please see nursing notes for vital signs. Test dose was given through epidural catheter and negative prior to continuing to dose epidural or start  infusion. Warning signs of high block given to the patient including shortness of breath, tingling/numbness in hands, complete motor block, or any concerning symptoms with instructions to call for help. Patient was given instructions on fall risk and not to get out of bed. All questions and concerns addressed with instructions to call with any issues or inadequate analgesia.   Patient tolerated the insertion well without immediate complications.Reason for block:procedure for pain

## 2020-06-14 NOTE — Lactation Note (Signed)
This note was copied from a baby's chart. Lactation Consultation Note  Patient Name: Betty Allen UUVOZ'D Date: 06/14/2020 Reason for consult: Initial assessment;Mother's request;1st time breastfeeding;Early term 37-38.6wks;Other (Comment) (Mom was IOL for IUGR) Age:20 hours  Maternal Data Has patient been taught Hand Expression?: Yes Does the patient have breastfeeding experience prior to this delivery?: No (Did not breast feed first baby)  Feeding Mother's Current Feeding Choice: Breast Milk Mom delivered after IOL for IUGR.  Betty Allen latched to left breast with assistance and sucked for 25 minutes.  Mom denies any breast or nipple pain.  Hand expression demonstrated.  2 ml of expressed colostrum was spoon fed to Betty Allen before he was able to latch.  Feeding cues explained to mom and encouraged her to put Betty Allen to the breast whenever he demonstrated hunger cues.  This is mom's second baby, but the first one to breast feed.  Hand out given on what to expect with breast feeding the first 4 days of life reviewing normal newborn stomach size, normal course of lactation, supply and demand and routine newborn feeding patterns.  Lactation [employer] given and discussed [employer], virtual support groups, informational web sites and contact numbers.  Lactation name and number written on white board in [place] and again on white board on M/B unit and encouraged mom to call with any questions, concerns or assistance.  LATCH Score Latch: Repeated attempts needed to sustain latch, nipple held in mouth throughout feeding, stimulation needed to elicit sucking reflex.  Audible Swallowing: A few with stimulation  Type of Nipple: Everted at rest and after stimulation  Comfort (Breast/Nipple): Soft / non-tender  Hold (Positioning): Assistance needed to correctly position infant at breast and maintain latch.  LATCH Score: 7   Lactation Tools Discussed/Used     Interventions Interventions: Breast feeding basics reviewed;Assisted with latch;Skin to skin;Breast massage;Hand express;Breast compression;Adjust position;Support pillows;Position options;Education  Discharge Discharge Education: Other (comment) WIC Program: Yes  Consult Status Consult Status: Follow-up Date: 06/14/20 Follow-up type: Call as needed    Jarold Motto 06/14/2020, 3:28 PM

## 2020-06-15 ENCOUNTER — Encounter: Payer: Self-pay | Admitting: Obstetrics and Gynecology

## 2020-06-15 DIAGNOSIS — O9081 Anemia of the puerperium: Secondary | ICD-10-CM | POA: Diagnosis not present

## 2020-06-15 LAB — CBC
HCT: 30.9 % — ABNORMAL LOW (ref 36.0–46.0)
Hemoglobin: 10.4 g/dL — ABNORMAL LOW (ref 12.0–15.0)
MCH: 29.5 pg (ref 26.0–34.0)
MCHC: 33.7 g/dL (ref 30.0–36.0)
MCV: 87.5 fL (ref 80.0–100.0)
Platelets: 201 10*3/uL (ref 150–400)
RBC: 3.53 MIL/uL — ABNORMAL LOW (ref 3.87–5.11)
RDW: 12.7 % (ref 11.5–15.5)
WBC: 12.2 10*3/uL — ABNORMAL HIGH (ref 4.0–10.5)
nRBC: 0 % (ref 0.0–0.2)

## 2020-06-15 LAB — VARICELLA ZOSTER ANTIBODY, IGG: Varicella IgG: 213 index (ref >165)

## 2020-06-15 LAB — RUBELLA SCREEN: Rubella: 3.23 index (ref >0.99)

## 2020-06-15 NOTE — Anesthesia Postprocedure Evaluation (Signed)
Anesthesia Post Note  Patient: Delbert D Waldren  Procedure(s) Performed: AN AD HOC LABOR EPIDURAL  Patient location during evaluation: Mother Baby Anesthesia Type: Epidural Level of consciousness: awake and alert Pain management: pain level controlled Vital Signs Assessment: post-procedure vital signs reviewed and stable Respiratory status: spontaneous breathing, nonlabored ventilation and respiratory function stable Cardiovascular status: stable Postop Assessment: no headache, no backache and epidural receding Anesthetic complications: no   No complications documented.   Last Vitals:  Vitals:   06/14/20 2312 06/15/20 0345  BP: 115/76 119/77  Pulse: 84 76  Resp: 18 18  Temp: 36.8 C 36.9 C  SpO2: 99% 98%    Last Pain:  Vitals:   06/15/20 0638  TempSrc:   PainSc: 0-No pain                 Blima Singer

## 2020-06-15 NOTE — Lactation Note (Signed)
This note was copied from a baby's chart. Lactation Consultation Note  Patient Name: Betty Allen ZSMOL'M Date: 06/15/2020 Reason for consult: Follow-up assessment;1st time breastfeeding;Early term 37-38.6wks;Other (Comment) (IUGR) Age:20 hours  Lactation follow-up prior to anticipated discharge.   Mom has been fully BF her newborn, doing well with weight gain reported, no pain/discomfort, several voids/no stool.   Mom desires education on pump use, for occasional use. Hand pump given, mom educated on set-up, use, frequency, milk storage, and cleaning.  LC reviewed newborn stomach size, feeding patterns and behaviors, growth spurts/cluster feedings, signs of adequate intake/content with feedings, and ongoing output expectations. Encouraged to track feedings and output for pediatrician appointments.   [place] educated mom on anticipated breast changes, breast fullness and engorgement management of both and nipple care.   Information given for outpatient lactation services and community breastfeeding support. Encouraged to call with questions and for ongoing breastfeeding needs.  Maternal Data Has patient been taught Hand Expression?: Yes Does the patient have breastfeeding experience prior to this delivery?: No (did not breastfeed her first child)  Feeding Mother's Current Feeding Choice: Breast Milk  LATCH Score                    Lactation Tools Discussed/Used Tools: Pump Breast pump type: Manual Pump Education: Setup, frequency, and cleaning;Milk Storage Reason for Pumping: mom's choice; PRN pumping Pumping frequency: as needed  Interventions Interventions: Breast feeding basics reviewed;Hand express;Hand pump;Education  Discharge Discharge Education: Engorgement and breast care;Warning signs for feeding baby;Outpatient recommendation Pump: Manual  Consult Status Consult Status: Complete Date: 06/15/20 Follow-up type: Call as needed    Lavonia Drafts 06/15/2020, 10:27 AM

## 2020-06-15 NOTE — Progress Notes (Signed)
Postpartum Day  1  Subjective: no complaints, up ad lib, voiding and tolerating PO  Doing well, no concerns. Ambulating without difficulty, pain managed with PO meds, tolerating regular diet, and voiding without difficulty.   No fever/chills, chest pain, shortness of breath, nausea/vomiting, or leg pain. No nipple or breast pain. No headache, visual changes, or RUQ/epigastric pain.  Objective: BP 119/84 (BP Location: Left Arm)   Pulse 82   Temp 98 F (36.7 C)   Resp 20   Ht 5\' 2"  (1.575 m)   Wt 57.6 kg   LMP 09/27/2019 (Exact Date)   SpO2 97%   BMI 23.23 kg/m    Physical Exam:  General: alert, cooperative and no distress Breasts: soft/nontender CV: RRR Pulm: nl effort, CTABL Abdomen: soft, non-tender, active bowel sounds Uterine Fundus: firm Perineum: minimal edema, intact Lochia: appropriate DVT Evaluation: No evidence of DVT seen on physical exam.  Recent Labs    06/14/20 0042 06/15/20 0335  HGB 10.8* 10.4*  HCT 32.7* 30.9*  WBC 11.7* 12.2*  PLT 190 201    Assessment/Plan: 20 y.o. [id number] postpartum day # 1  -Continue routine postpartum care -Lactation consult PRN for breastfeeding  -Infant currently in SCN for circumcision -She reports some difficulty with breastfeeding and states that she has received help with all feeds.  We discussed staying an additional night so that she could work on breastfeeding and receive help from lactation and Engineer, civil (consulting).  I also reviewed that babies can be fussy after circumcisions so she may notice that feeds are different over the next 1-2 days.  -Discussed contraceptive options including implant, IUDs hormonal and non-hormonal, injection, pills/ring/patch, condoms, and NFP.  -Acute blood loss anemia - hemodynamically stable and asymptomatic; start PO ferrous sulfate BID with stool softeners  -Immunization status:   all immunizations up to date   Disposition: Desires discharge home today.  May consider staying if she desires  additional breastfeeding support.    LOS: 1 day   Minda Meo, [place] 06/15/2020, 8:23 AM   ----- Drinda Butts  Certified Nurse Midwife [facility]

## 2020-06-16 ENCOUNTER — Ambulatory Visit: Payer: Self-pay

## 2020-06-16 MED ORDER — MEDROXYPROGESTERONE ACETATE 150 MG/ML IM SUSP
150.0000 mg | INTRAMUSCULAR | Status: DC
  Administered 2020-06-16: 150 mg via INTRAMUSCULAR
  Filled 2020-06-16: qty 1

## 2020-06-16 MED ORDER — IBUPROFEN 600 MG PO TABS: 600.0000 mg | ORAL_TABLET | Freq: Four times a day (QID) | ORAL | 0 refills | Status: DC | PRN

## 2020-06-16 MED ORDER — ACETAMINOPHEN 325 MG PO TABS: 650.0000 mg | ORAL_TABLET | ORAL | Status: DC | PRN

## 2020-06-16 MED ORDER — FERROUS SULFATE 325 (65 FE) MG PO TABS: 325.0000 mg | ORAL_TABLET | Freq: Two times a day (BID) | ORAL | 3 refills | Status: DC

## 2020-06-16 MED ORDER — MEDROXYPROGESTERONE ACETATE 150 MG/ML IM SUSP: 150.0000 mg | INTRAMUSCULAR | 3 refills | Status: DC

## 2020-06-16 NOTE — Progress Notes (Signed)
Postpartum Day  2  Subjective: no complaints, up ad lib, voiding and tolerating PO  Doing well, no concerns. Ambulating without difficulty, pain managed with PO meds, tolerating regular diet, and voiding without difficulty. Bilateral nipple soreness.   No fever/chills, chest pain, shortness of breath, nausea/vomiting, or leg pain. No breast pain or engorgement. No headache, visual changes, or RUQ/epigastric pain.  Objective: BP 115/76 (BP Location: Right Arm)   Pulse 82   Temp 98.5 F (36.9 C) (Oral)   Resp 18   Ht 5\' 2"  (1.575 m)   Wt 57.6 kg   LMP 09/27/2019 (Exact Date)   SpO2 97%   Breastfeeding Unknown   BMI 23.23 kg/m    Physical Exam:  General: alert, cooperative and no distress Breasts: nipple abrasions/excoriation/bruising bilaterally, Left worse than right.  CV: RRR Pulm: nl effort, CTABL Abdomen: soft, non-tender, active bowel sounds Uterine Fundus: firm Perineum: minimal edema, intact Lochia: appropriate DVT Evaluation: No evidence of DVT seen on physical exam.  Recent Labs    06/14/20 0042 06/15/20 0335  HGB 10.8* 10.4*  HCT 32.7* 30.9*  WBC 11.7* 12.2*  PLT 190 201    Assessment/Plan: 20 y.o. [id number] postpartum day # 2  -Continue routine postpartum care -Lactation consult PRN for breastfeeding, encouraged to make PP outpatient visit with Lactation for support.  -Discussed contraceptive options including implant, IUDs hormonal and non-hormonal, injection, pills/ring/patch, condoms, and NFP. Desires Depo today.  -Acute blood loss anemia - hemodynamically stable and asymptomatic; start PO ferrous sulfate BID with stool softeners  -Immunization status:   all immunizations up to date   Disposition: Desires discharge home today.    LOS: 2 days   Francetta Found, CNM 06/16/2020, 8:20 AM   [facility]

## 2020-06-16 NOTE — Progress Notes (Signed)
Patient discharge information reviewed with patient.Postpartum care, breast care, breastfeeding and pumping reviewed with patient F/u appointments reviewed with patient. Patient verbally states understanding and questions answered. Patient escorted with infant accompanying to personal vehicle.

## 2020-06-16 NOTE — Discharge Instructions (Signed)
Vaginal Delivery, Care After Refer to this sheet in the next few weeks. These discharge instructions provide you with information on caring for yourself after delivery. Your caregiver may also give you specific instructions. Your treatment has been planned according to the most current medical practices available, but problems sometimes occur. Call your caregiver if you have any problems or questions after you go home. HOME CARE INSTRUCTIONS 1. Take over-the-counter or prescription medicines only as directed by your caregiver or pharmacist. 2. Do not drink alcohol, especially if you are breastfeeding or taking medicine to relieve pain. 3. Do not smoke tobacco. 4. Continue to use good perineal care. Good perineal care includes: 1. Wiping your perineum from back to front 2. Keeping your perineum clean. 3. You can do sitz baths twice a day, to help keep this area clean 5. Do not use tampons, douche or have sex until your caregiver says it is okay. 6. Shower only and avoid sitting in submerged water, aside from sitz baths 7. Wear a well-fitting bra that provides breast support. 8. Eat healthy foods. 9. Drink enough fluids to keep your urine clear or pale yellow. 10. Eat high-fiber foods such as whole grain cereals and breads, brown rice, beans, and fresh fruits and vegetables every day. These foods may help prevent or relieve constipation. 11. Avoid constipation with high fiber foods or medications, such as miralax or metamucil 12. Follow your caregiver's recommendations regarding resumption of activities such as climbing stairs, driving, lifting, exercising, or traveling. 50. Talk to your caregiver about resuming sexual activities. Resumption of sexual activities is dependent upon your risk of infection, your rate of healing, and your comfort and desire to resume sexual activity. 14. Try to have someone help you with your household activities and your newborn for at least a few days after you leave  the hospital. 15. Rest as much as possible. Try to rest or take a nap when your newborn is sleeping. 16. Increase your activities gradually. 77. Keep all of your scheduled postpartum appointments. It is very important to keep your scheduled follow-up appointments. At these appointments, your caregiver will be checking to make sure that you are healing physically and emotionally. SEEK MEDICAL CARE IF:   You are passing large clots from your vagina. Save any clots to show your caregiver.  You have a foul smelling discharge from your vagina.  You have trouble urinating.  You are urinating frequently.  You have pain when you urinate.  You have a change in your bowel movements.  You have increasing redness, pain, or swelling near your vaginal incision (episiotomy) or vaginal tear.  You have pus draining from your episiotomy or vaginal tear.  Your episiotomy or vaginal tear is separating.  You have painful, hard, or reddened breasts.  You have a severe headache.  You have blurred vision or see spots.  You feel sad or depressed.  You have thoughts of hurting yourself or your newborn.  You have questions about your care, the care of your newborn, or medicines.  You are dizzy or light-headed.  You have a rash.  You have nausea or vomiting.  You were breastfeeding and have not had a menstrual period within 12 weeks after you stopped breastfeeding.  You are not breastfeeding and have not had a menstrual period by the 12th week after delivery.  You have a fever. SEEK IMMEDIATE MEDICAL CARE IF:   You have persistent pain.  You have chest pain.  You have shortness of breath.  You faint.  You have leg pain.  You have stomach pain.  Your vaginal bleeding saturates two or more sanitary pads in 1 hour. MAKE SURE YOU:   Understand these instructions.  Will watch your condition.  Will get help right away if you are not doing well or get worse. Document Released:  01/14/2000 Document Revised: 06/02/2013 Document Reviewed: 09/13/2011 [facility] Patient Information 2015 [place], [place]. This information is not intended to replace advice given to you by your health care provider. Make sure you discuss any questions you have with your health care provider.  Sitz Bath A sitz bath is a warm water bath taken in the sitting position. The water covers only the hips and butt (buttocks). We recommend using one that fits in the toilet, to help with ease of use and cleanliness. It may be used for either healing or cleaning purposes. Sitz baths are also used to relieve pain, itching, or muscle tightening (spasms). The water may contain medicine. Moist heat will help you heal and relax.  HOME CARE  Take 3 to 4 sitz baths a day. 18. Fill the bathtub half-full with warm water. 19. Sit in the water and open the drain a little. 20. Turn on the warm water to keep the tub half-full. Keep the water running constantly. 21. Soak in the water for 15 to 20 minutes. 22. After the sitz bath, pat the affected area dry. GET HELP RIGHT AWAY IF: You get worse instead of better. Stop the sitz baths if you get worse. MAKE SURE YOU:  Understand these instructions.  Will watch your condition.  Will get help right away if you are not doing well or get worse. Document Released: 02/24/2004 Document Revised: 10/11/2011 Document Reviewed: 05/16/2010 [facility] Patient Information 2015 [place], [place]. This information is not intended to replace advice given to you by your health care provider. Make sure you discuss any questions you have with your health care provider.   Postpartum Baby Blues The postpartum period begins right after the birth of a baby. During this time, there is often joy and excitement. It is also a time of many changes in the life of the parents. A mother may feel happy one minute and sad or stressed the next. These feelings of sadness, called the baby blues, usually happen  in the period right after the baby is born and go away within a week or two. What are the causes? The exact cause of this condition is not known. Changes in hormone levels after childbirth are believed to trigger some of the symptoms. Other factors that can play a role in these mood changes include:  Lack of sleep.  Stressful life events, such as financial problems, caring for a loved one, or death of a loved one.  Genetics. What are the signs or symptoms? Symptoms of this condition include:  Changes in mood, such as going from extreme happiness to sadness.  A decrease in concentration.  Difficulty sleeping.  Crying spells and tearfulness.  Loss of appetite.  Irritability.  Anxiety. If these symptoms last for more than 2 weeks or become more severe, you may have postpartum depression. How is this diagnosed? This condition is diagnosed based on an evaluation of your symptoms. Your health care provider may use a screening tool that includes a list of questions to help identify a person with the baby blues or postpartum depression. How is this treated? The baby blues usually go away on their own in 1-2 weeks. Social support is  often what is needed. You will be encouraged to get adequate sleep and rest. Follow these instructions at home: Lifestyle  Get as much rest as you can. Take a nap when the baby sleeps.  Exercise regularly as told by your health care provider. Some women find yoga and walking to be helpful.  Eat a balanced and nourishing diet. This includes plenty of fruits and vegetables, whole grains, and lean proteins.  Do little things that you enjoy. Take a bubble bath, read your favorite magazine, or listen to your favorite music.  Avoid alcohol.  Ask for help with household chores, cooking, grocery shopping, or running errands. Do not try to do everything yourself. Consider hiring a postpartum doula to help. This is a professional who specializes in providing  support to new mothers.  Try not to make any major life changes during pregnancy or right after giving birth. This can add stress.      General instructions  Talk to people close to you about how you are feeling. Get support from your partner, family members, friends, or other new moms. You may want to join a support group.  Find ways to manage stress. This may include: ? Writing your thoughts and feelings in a journal. ? Spending time outside. ? Spending time with people who make you laugh.  Try to stay positive in how you think. Think about the things you are grateful for.  Take over-the-counter and prescription medicines only as told by your health care provider.  Let your health care provider know if you have any concerns.  Keep all postpartum visits. This is important. Contact a health care provider if:  Your baby blues do not go away after 2 weeks. Get help right away if:  You have thoughts of taking your own life (suicidal thoughts), or of harming your baby or someone else.  You see or hear things that are not there (hallucinations). If you ever feel like you may hurt yourself or others, or have thoughts about taking your own life, get help right away. Go to your nearest emergency department or:  Call your local emergency services (911 in the [place]  Call a suicide crisis helpline, such as the [facility], at (854)021-2577. This is open 24 hours a day in the [place]  Text the Crisis Text Line at (845)082-5269 (in the [place] Summary  After giving birth, you may feel happy one minute and sad or stressed the next. Feelings of sadness that happen right after the baby is born and go away after a week or two are called the baby blues.  You can manage the baby blues by getting enough rest, eating a healthy diet, exercising, spending time with supportive people, and finding ways to manage stress.  If feelings of sadness and stress last longer than 2 weeks or  get in the way of caring for your baby, talk with your health care provider. This may mean you have postpartum depression. This information is not intended to replace advice given to you by your health care provider. Make sure you discuss any questions you have with your health care provider. Document Revised: 07/11/2019 Document Reviewed: 07/11/2019 Elsevier Patient Education  2021 [employer].

## 2020-06-16 NOTE — Lactation Note (Signed)
This note was copied from a baby's chart. Lactation Consultation Note  Patient Name: Betty Allen PZWCH'E Date: 06/16/2020   Age:20 hours Lactation Rounds: LC to the room for a visit. Mother and baby are resting in bed. Mother states feeds are going well but the latch is painful. LC reviewed and encouraged feeding on demand and with cues. If baby is not cueing we encourage hand expression and spoon feed to wake baby. Reviewed diaper counts for days of life and when to call Peds with questions. Reviewed "understanding Postpartum and Newborn care " booklet at bedside. Reviewed outpatient Lactation number and resources. LC encouraged latching in a cross cradle for a deeper latch and less pain. Mother's nipples are very tender but the pain is when she initially latches for 15-30 seconds. After that time the pain subsides. LC did note a stringy frenulum under baby's tongue while he was crying. Baby fed well with swallows noted. Reviewed pacifier, pumping, and bottles are not encouraged until breastfeeding is established and going well in the first 4 weeks. Parents stated understanding with all teaching.      Betty Allen 06/16/2020, 4:26 PM

## 2020-06-17 LAB — SURGICAL PATHOLOGY

## 2020-06-23 NOTE — Addendum Note (Signed)
Addended by: Cletis Media on: 06/23/2020 03:35 PM   Modules accepted: Orders

## 2020-06-30 DIAGNOSIS — Z419 Encounter for procedure for purposes other than remedying health state, unspecified: Secondary | ICD-10-CM | POA: Diagnosis not present

## 2020-07-07 ENCOUNTER — Encounter: Payer: Self-pay | Admitting: Advanced Practice Midwife

## 2020-07-07 ENCOUNTER — Telehealth: Payer: Self-pay

## 2020-07-07 NOTE — Telephone Encounter (Signed)
Patient called earlier today stating she has some FMLA paperwork she would like to have filled out.   Informed patient she can drop off forms anytime and provider will fill out, we will call her when they are ready for pick up.   FMLA dropped of 07/07/20 and handed over to E. Sciora, CNM.   Adalberto Cole, RN

## 2020-07-08 ENCOUNTER — Telehealth: Payer: Self-pay | Admitting: Family Medicine

## 2020-07-08 NOTE — Telephone Encounter (Signed)
Patient called to ask if her maternity leave paperwork had been filed out yet. She needs to turn it in to her workplace by Monday June 13th. Thank you!!

## 2020-07-08 NOTE — Telephone Encounter (Signed)
Medical leave paperwork available on [facility] nurse to do cart and per E. Odetta Pink RN, copy has been sent for scanning. Client notified forms ready for pick up in [facility] and states will come today to pick up. Rich Number, RN

## 2020-07-30 DIAGNOSIS — Z419 Encounter for procedure for purposes other than remedying health state, unspecified: Secondary | ICD-10-CM | POA: Diagnosis not present

## 2020-08-13 ENCOUNTER — Telehealth: Payer: Self-pay

## 2020-08-13 NOTE — Telephone Encounter (Signed)
TC to patient to schedule PP appointment. Patient states she received Depo at the hospital after delivery of her baby on 06/14/2020. She is currently [redacted]w[redacted]d since her Depo injection. Patient scheduled for 08/16/2020.Marland KitchenJenetta Downer, RN

## 2020-08-16 ENCOUNTER — Encounter: Payer: Self-pay | Admitting: Advanced Practice Midwife

## 2020-08-16 ENCOUNTER — Ambulatory Visit: Payer: Medicaid Other | Admitting: Advanced Practice Midwife

## 2020-08-16 ENCOUNTER — Other Ambulatory Visit: Payer: Self-pay

## 2020-08-16 DIAGNOSIS — A749 Chlamydial infection, unspecified: Secondary | ICD-10-CM

## 2020-08-16 LAB — WET PREP FOR TRICH, YEAST, CLUE
Trichomonas Exam: NEGATIVE
Yeast Exam: NEGATIVE

## 2020-08-16 LAB — HEMOGLOBIN, FINGERSTICK: Hemoglobin: 12.5 g/dL (ref 11.1–15.9)

## 2020-08-16 MED ORDER — MEDROXYPROGESTERONE ACETATE 150 MG/ML IM SUSP
150.0000 mg | INTRAMUSCULAR | Status: AC
  Administered 2020-11-11: 150 mg via INTRAMUSCULAR

## 2020-08-16 NOTE — Progress Notes (Addendum)
Wet prep and hgb reviewed - no interventions required per standing order. Rich Number, RN

## 2020-08-16 NOTE — Progress Notes (Signed)
Post Partum Exam  Betty Allen is a 20 y.o. SBF [id number] (20 yo, 2 mo old) nonsmoker female who presents for a postpartum visit. She is 9 weeks postpartum following a spontaneous vaginal delivery after IOL for IUGR with EFW=3%. M 5#14 on 06/14/20 and DMPA given 06/16/20. Went back to work 6 wks pp; mom and FOB and his mom are helping her with the kids. Breastfeeding only at night x3 and pumping q 1-3x/day and formula feeding q 2 hours (4 oz). Saw peds on 07/20/20. I have fully reviewed the prenatal and intrapartum course. The delivery was at 30 2/7 gestational weeks.  Anesthesia: epidural. Postpartum course has been wnl. Baby's course has been wnl. Baby is feeding by  breast and formula.  Bleeding moderate lochia. Bowel function is normal. Bladder function is normal. Patient is not sexually active. Contraception method is abstinence.   Postpartum depression screening:  Edinburgh Postnatal Depression Scale - 08/16/20 1011       Edinburgh Postnatal Depression Scale:  In the Past 7 Days   I have been able to laugh and see the funny side of things. 0    I have looked forward with enjoyment to things. 0    I have blamed myself unnecessarily when things went wrong. 0    I have been anxious or worried for no good reason. 0    I have felt scared or panicky for no good reason. 0    Things have been getting on top of me. 0    I have been so unhappy that I have had difficulty sleeping. 0    I have felt sad or miserable. 0    I have been so unhappy that I have been crying. 0    The thought of harming myself has occurred to me. 0    Edinburgh Postnatal Depression Scale Total 0              The following portions of the patient's history were reviewed and updated as appropriate: allergies, current medications, past family history, past medical history, past social history, past surgical history, and problem list. Last pap smear n/a   Review of Systems Pertinent items are noted in HPI.     Objective:  BP 108/69   Pulse 62   Ht 5\' 2"  (1.575 m)   Wt 108 lb 3.2 oz (49.1 kg)   Breastfeeding Yes Comment: Breast and formula feeding infant.  BMI 19.79 kg/m   Gen: well appearing, NAD HEENT: no scleral icterus CV: RR Lung: Normal WOB Breast:performed-not indicated ; lactating and pt denies bleeding, lumps, pain Ext: warm well perfused  GU: pt refuses exam Rectal: performed -   pt refuses exam and wants to self collect       Assessment:    9 wks postpartum exam. Pap smear not done at today's visit.   Plan:   Essential components of care per ACOG recommendations for Comprehensive Postpartum exam:  1.  Mood and well being: Patient with negative depression screening today. Reviewed local resources for support. EPDS is low risk. Reviewed resources and that mood sx in first year after pregnancy are considered related to pregnancy and to reach out for help at [facility] if needed. Discussed [facility] as link to care and availability of LCSW for counseling  - Patient does not use tobacco.  - hx of drug use? No     2. Infant care and feeding:  -Patient currently breastmilk feeding? Yes If breastmilk feeding discussed return  to work and pumping. If needed, patient was provided letter for work to allow for every 2-3 hr pumping breaks, and to be granted a private location to express breastmilk and refrigerated area to store breastmilk. Reviewed importance of draining breast regularly to support lactation. I  -Recommended patient engage with WIC/BFpeer counselors  -Counseled to sign new child up for [facility] services -Social determinants of health (SDOH) reviewed in EPIC. No concerns.   3. Sexuality, contraception and birth spacing  Contraception: Contraception counseling: Reviewed all forms of birth control options in the tiered based approach. available including abstinence; over the counter/barrier methods; hormonal contraceptive medication including pill, patch, ring, injection,contraceptive  implant; hormonal and nonhormonal IUDs; permanent sterilization options including vasectomy and the various tubal sterilization modalities. Risks, benefits, and typical effectiveness rates were reviewed.  Questions were answered.  Written information was also given to the patient to review.  Patient desires continuation of DMPA, this was prescribed for patient. She will follow up in  09/01/20 for surveillance.  She was told to call with any further questions, or with any concerns about this method of contraception.  Emphasized use of condoms 100% of the time for STI prevention.  Patient was offered ECP. ECP was not accepted by the patient. ECP counseling was not given - see RN documentation  - Patient does not want a pregnancy in the next year.  Desired family size is 2 children.  - Reviewed forms of contraception in tiered fashion. Patient desired Depo-Provera today.   - Discussed birth spacing of 18 months  4. Sleep and fatigue -Encouraged family/partner/community support of 4 hrs of uninterrupted sleep to help with mood and fatigue  5. Physical Recovery  - Discussed patients delivery and complications - Patient had a 0 degree laceration, perineal healing reviewed. Patient expressed understanding - Patient has urinary incontinence? No  - Patient is safe to resume physical and sexual activity  6.  Health Maintenance/Chronic Disease Health Maintenance Due  Topic Date Due   HPV VACCINES (1 - 2-dose series) Never done    - Last pap smear: n/a  1. Postpartum exam DMPA 150 mg IM q 11-13 wks x 1 year (not today). Last DMPA 06/16/20.  - Hemoglobin, venipuncture - [facility] [place], [place], CLUE   Patient given handout about PCP care in the community Given MVI per family planning program guidelines and availability  Follow up in: 2 weeks or as needed.

## 2020-08-30 DIAGNOSIS — Z419 Encounter for procedure for purposes other than remedying health state, unspecified: Secondary | ICD-10-CM | POA: Diagnosis not present

## 2020-09-28 ENCOUNTER — Ambulatory Visit: Payer: Medicaid Other

## 2020-09-30 DIAGNOSIS — Z419 Encounter for procedure for purposes other than remedying health state, unspecified: Secondary | ICD-10-CM | POA: Diagnosis not present

## 2020-10-06 ENCOUNTER — Ambulatory Visit: Payer: Medicaid Other

## 2020-10-30 DIAGNOSIS — Z419 Encounter for procedure for purposes other than remedying health state, unspecified: Secondary | ICD-10-CM | POA: Diagnosis not present

## 2020-11-11 ENCOUNTER — Ambulatory Visit (LOCAL_COMMUNITY_HEALTH_CENTER): Payer: Medicaid Other | Admitting: Family Medicine

## 2020-11-11 ENCOUNTER — Encounter: Payer: Self-pay | Admitting: Family Medicine

## 2020-11-11 ENCOUNTER — Other Ambulatory Visit: Payer: Self-pay

## 2020-11-11 VITALS — BP 120/68 | Ht 62.0 in | Wt 98.2 lb

## 2020-11-11 DIAGNOSIS — Z30013 Encounter for initial prescription of injectable contraceptive: Secondary | ICD-10-CM | POA: Diagnosis not present

## 2020-11-11 DIAGNOSIS — Z3009 Encounter for other general counseling and advice on contraception: Secondary | ICD-10-CM | POA: Diagnosis not present

## 2020-11-11 DIAGNOSIS — Z3042 Encounter for surveillance of injectable contraceptive: Secondary | ICD-10-CM

## 2020-11-11 NOTE — Progress Notes (Signed)
Patient here for Depo. Had PP visit 08/16/2020 and last Depo 06/16/2020. At [facility] visit, provider wrote an order for Depo for 1 year. Jenetta Downer, RN

## 2020-11-11 NOTE — Progress Notes (Signed)
Depo given today after consult with provider, Hilario Quarry, FNP. Given per E. Sciora orders, CNM, on 08/16/2020, for DMPA q 11-13 weeks for 1 year. Depo consent signed, and next Depo due card given.Jenetta Downer, RN

## 2020-11-15 NOTE — Progress Notes (Signed)
Pt in clinic today for DMPA, last  DMPA was 06/16/2020.  Patient reports no sex since before delivery of baby.   Discussed with patient the importance of getting DMPA every 11-13 weeks.    Discussed other type of LARC, pt declined and wants to keep using Depo .    OK for patient to get DMPA today and to continue with orders from E.S on 08/16/2020.    Recommended Condoms for back up for next 7-10 days with any sex .   Pt verbalizes understanding.   See RN documentation for DMPA administration.    Junious Dresser, FNP

## 2020-11-30 DIAGNOSIS — Z419 Encounter for procedure for purposes other than remedying health state, unspecified: Secondary | ICD-10-CM | POA: Diagnosis not present

## 2020-12-30 DIAGNOSIS — Z419 Encounter for procedure for purposes other than remedying health state, unspecified: Secondary | ICD-10-CM | POA: Diagnosis not present

## 2021-01-30 DIAGNOSIS — Z419 Encounter for procedure for purposes other than remedying health state, unspecified: Secondary | ICD-10-CM | POA: Diagnosis not present

## 2021-03-02 DIAGNOSIS — Z419 Encounter for procedure for purposes other than remedying health state, unspecified: Secondary | ICD-10-CM | POA: Diagnosis not present

## 2021-03-30 DIAGNOSIS — Z419 Encounter for procedure for purposes other than remedying health state, unspecified: Secondary | ICD-10-CM | POA: Diagnosis not present

## 2021-04-30 DIAGNOSIS — Z419 Encounter for procedure for purposes other than remedying health state, unspecified: Secondary | ICD-10-CM | POA: Diagnosis not present

## 2021-05-30 DIAGNOSIS — Z419 Encounter for procedure for purposes other than remedying health state, unspecified: Secondary | ICD-10-CM | POA: Diagnosis not present

## 2021-06-30 DIAGNOSIS — Z419 Encounter for procedure for purposes other than remedying health state, unspecified: Secondary | ICD-10-CM | POA: Diagnosis not present

## 2021-07-30 DIAGNOSIS — Z419 Encounter for procedure for purposes other than remedying health state, unspecified: Secondary | ICD-10-CM | POA: Diagnosis not present

## 2021-10-05 ENCOUNTER — Emergency Department: Payer: Medicaid Other

## 2021-10-05 ENCOUNTER — Emergency Department
Admission: EM | Admit: 2021-10-05 | Discharge: 2021-10-05 | Disposition: A | Discharge status: Home / Self Care | Payer: Medicaid Other | Attending: Emergency Medicine | Admitting: Emergency Medicine

## 2021-10-05 ENCOUNTER — Encounter: Payer: Self-pay | Admitting: Emergency Medicine

## 2021-10-05 ENCOUNTER — Other Ambulatory Visit: Payer: Self-pay

## 2021-10-05 DIAGNOSIS — W208XXA Other cause of strike by thrown, projected or falling object, initial encounter: Secondary | ICD-10-CM | POA: Insufficient documentation

## 2021-10-05 DIAGNOSIS — S92422A Displaced fracture of distal phalanx of left great toe, initial encounter for closed fracture: Secondary | ICD-10-CM | POA: Insufficient documentation

## 2021-10-05 MED ORDER — ACETAMINOPHEN 500 MG PO TABS
1000.0000 mg | ORAL_TABLET | Freq: Once | ORAL | Status: AC
  Administered 2021-10-05: 1000 mg via ORAL
  Filled 2021-10-05: qty 2

## 2021-10-05 NOTE — Discharge Instructions (Addendum)
-  When walking, please keep the postoperative shoe on.  -Please call the orthopedist listed in these instructions to schedule an appointment for follow-up.  -You may take Tylenol/ibuprofen as needed for pain.  -Return to the emergency department anytime if you begin to experience any new or worsening symptoms.

## 2021-10-05 NOTE — ED Provider Notes (Signed)
[facility] Provider Note    Event Date/Time   First MD Initiated Contact with Patient 10/05/21 1112     (approximate)   History   Chief Complaint Toe Pain   HPI Betty Allen is a 21 y.o. female, no significant medical history, presents emergency department for evaluation of toe pain.  She states that she dropped a large candle on her toe yesterday, and has been having difficulty walking since.  Denies any other injuries or recent illnesses.  Reports increased swelling at the base of the toe with slightly decreased sensation distally.  Denies ankle pain, foot pain, lower leg pain, fever/chills, dizziness/lightheadedness, or nausea/vomiting.  History Limitations: No limitations.        Physical Exam  Triage Vital Signs: ED Triage Vitals  Enc Vitals Group     BP 10/05/21 1044 101/73     Pulse Rate 10/05/21 1044 92     Resp 10/05/21 1044 16     Temp 10/05/21 1044 98.7 F (37.1 C)     Temp Source 10/05/21 1044 Oral     SpO2 10/05/21 1044 100 %     Weight --      Height --      Head Circumference --      Peak Flow --      Pain Score 10/05/21 1043 8     Pain Loc --      Pain Edu? --      Excl. in [place]? --     Most recent vital signs: Vitals:   10/05/21 1044  BP: 101/73  Pulse: 92  Resp: 16  Temp: 98.7 F (37.1 C)  SpO2: 100%    General: Awake, appears uncomfortable. Skin: Warm, dry. No rashes or lesions.  Eyes: PERRL. Conjunctivae normal.  CV: Good peripheral perfusion.  Resp: Normal effort.  Abd: Soft, non-tender. No distention.  Neuro: At baseline. No gross neurological deficits.  Musculoskeletal: Normal ROM of all extremities.   Focused Exam: No gross deformities to the affected toe, though does have ecchymosis present along the dorsal and plantar aspect.  Limited range of motion due to pain.  Normal cap refill distally.  Sensation slightly decreased  Physical Exam    ED Results / Procedures / Treatments  Labs (all  labs ordered are listed, but only abnormal results are displayed) Labs Reviewed - No data to display   EKG N/A.   RADIOLOGY  ED Provider Interpretation: Personally viewed and interpreted this x-ray, minimally displaced intra-articular fracture at the base of the distal phalanx of the left great toe  DG Toe Great Left  Result Date: 10/05/2021 CLINICAL DATA:  Pt complains of great toe pain after dropping a candle on her foot earlier today EXAM: LEFT GREAT TOE COMPARISON:  None Available. FINDINGS: There is a minimally displaced corner fracture at the base of the distal phalanx of the toe with intra-articular extension. No evidence of dislocation. Regional soft tissues are unremarkable. IMPRESSION: Minimally displaced intra-articular fracture at the base of the distal phalanx of the left great toe. Electronically Signed   By: Audie Pinto M.D.   On: 10/05/2021 11:49    PROCEDURES:  Critical Care performed: N/A.  Procedures    MEDICATIONS ORDERED IN ED: Medications  acetaminophen (TYLENOL) tablet 1,000 mg (1,000 mg Oral Given 10/05/21 1132)     IMPRESSION / MDM / ASSESSMENT AND PLAN / ED COURSE  I reviewed the triage vital signs and the nursing notes.  Differential diagnosis includes, but is not limited to, toe fracture, toe dislocation, extensor tendon injury,   ED Course Patient appears well, vitals within normal limits.  Currently nursing mild-moderate pain.  Will treat with acetaminophen.  Assessment/Plan Patient presents with toe pain after dropping a large candle on her right great toe.  Found to have a minimally displaced intra-articular fracture at the base of the distal phalanx of the left great toe.  No signs of dislocation.  She appears well clinically.  Neurovascular intact.  Will buddy tape the toe and provide her with a postoperative shoe.  Provide her with a referral to orthopedics as well, with instructions to call today to schedule  appointment for follow-up.  She is amenable to this plan.  Will discharge.  Provided the patient with anticipatory guidance, return precautions, and educational material. Encouraged the patient to return to the emergency department at any time if they begin to experience any new or worsening symptoms. Patient expressed understanding and agreed with the plan.   Patient's presentation is most consistent with acute complicated illness / injury requiring diagnostic workup.       FINAL CLINICAL IMPRESSION(S) / ED DIAGNOSES   Final diagnoses:  Displaced fracture of distal phalanx of left great toe, initial encounter for closed fracture     Rx / DC Orders   ED Discharge Orders     None        Note:  This document was prepared using Dragon voice recognition software and may include unintentional dictation errors.   Teodoro Spray, [place] 10/05/21 1215    Vanessa Orr, MD 10/06/21 1110

## 2021-10-05 NOTE — ED Notes (Signed)
Says dropped a large jar candle on her foot yesterday.  Landed on left first toe.  Has swelling and bruising.  Acrylic nail is broken

## 2021-10-05 NOTE — ED Triage Notes (Signed)
Pt to ED via POV for toe pain. Pt dropped a large candle on her toe yesterday.

## 2021-12-19 ENCOUNTER — Emergency Department
Admission: EM | Admit: 2021-12-19 | Discharge: 2021-12-19 | Disposition: A | Discharge status: Home / Self Care | Payer: Medicaid Other | Attending: Emergency Medicine | Admitting: Emergency Medicine

## 2021-12-19 DIAGNOSIS — N390 Urinary tract infection, site not specified: Secondary | ICD-10-CM | POA: Insufficient documentation

## 2021-12-19 LAB — URINALYSIS, ROUTINE W REFLEX MICROSCOPIC
Bilirubin Urine: NEGATIVE
Glucose, UA: NEGATIVE mg/dL
Ketones, ur: NEGATIVE mg/dL
Nitrite: POSITIVE — AB
Protein, ur: 100 mg/dL — AB
RBC / HPF: >50 RBC/hpf — ABNORMAL HIGH (ref 0–5)
Specific Gravity, Urine: 1.019 (ref 1.005–1.030)
WBC, UA: >50 WBC/hpf — ABNORMAL HIGH (ref 0–5)
pH: 5 (ref 5.0–8.0)

## 2021-12-19 LAB — POC URINE PREG, ED: Preg Test, Ur: NEGATIVE

## 2021-12-19 MED ORDER — PHENAZOPYRIDINE HCL 100 MG PO TABS: 100.0000 mg | ORAL_TABLET | Freq: Three times a day (TID) | ORAL | 0 refills | Status: AC | PRN

## 2021-12-19 MED ORDER — CEPHALEXIN 500 MG PO CAPS: 1000.0000 mg | ORAL_CAPSULE | Freq: Two times a day (BID) | ORAL | 0 refills | Status: DC

## 2021-12-19 NOTE — ED Provider Notes (Signed)
[facility] Provider Note  Patient Contact: 3:51 PM (approximate)   History   Urinary Frequency   HPI  Betty Allen is a 21 y.o. female who presents the emergency department complaining of burning urination.  Patient has had symptoms for roughly 10 days.  No flank pain.  No abdominal pain.  No fevers or chills, emesis, diarrhea, vaginal discharge or bleeding.  Patient denies chance of pregnancy.     Physical Exam   Triage Vital Signs: ED Triage Vitals  Enc Vitals Group     BP 12/19/21 1449 118/79     Pulse Rate 12/19/21 1449 85     Resp 12/19/21 1449 18     Temp 12/19/21 1449 98.8 F (37.1 C)     Temp Source 12/19/21 1449 Oral     SpO2 12/19/21 1449 100 %     Weight 12/19/21 1451 115 lb (52.2 kg)     Height 12/19/21 1451 '5\' 2"'$  (1.575 m)     Head Circumference --      Peak Flow --      Pain Score 12/19/21 1451 8     Pain Loc --      Pain Edu? --      Excl. in [place]? --     Most recent vital signs: Vitals:   12/19/21 1449  BP: 118/79  Pulse: 85  Resp: 18  Temp: 98.8 F (37.1 C)  SpO2: 100%     General: Alert and in no acute distress.  Cardiovascular:  Good peripheral perfusion Respiratory: Normal respiratory effort without tachypnea or retractions. Lungs CTAB.  Gastrointestinal: Bowel sounds 4 quadrants. Soft and nontender to palpation. No guarding or rigidity. No palpable masses. No distention. No CVA tenderness. Musculoskeletal: Full range of motion to all extremities.  Neurologic:  No gross focal neurologic deficits are appreciated.  Skin:   No rash noted Other:   ED Results / Procedures / Treatments   Labs (all labs ordered are listed, but only abnormal results are displayed) Labs Reviewed  URINALYSIS, ROUTINE W REFLEX MICROSCOPIC - Abnormal; Notable for the following components:      Result Value   Color, Urine YELLOW (*)    APPearance CLOUDY (*)    Hgb urine dipstick LARGE (*)    Protein, ur 100 (*)    Nitrite  POSITIVE (*)    Leukocytes,Ua LARGE (*)    RBC / HPF >50 (*)    WBC, UA >50 (*)    Bacteria, UA MANY (*)    All other components within normal limits  POC URINE PREG, ED  POC URINE PREG, ED     EKG     RADIOLOGY    No results found.  PROCEDURES:  Critical Care performed: No  Procedures   MEDICATIONS ORDERED IN ED: Medications - No data to display   IMPRESSION / MDM / [place] / ED COURSE  I reviewed the triage vital signs and the nursing notes.                              Differential diagnosis includes, but is not limited to, uti, pyelonephritis, nephrolithiasis, pregnancy  Patient's presentation is most consistent with acute presentation with potential threat to life or bodily function.   Patient's diagnosis is consistent with UTI.  Patient presents emergency department with burning with urination.  Exam was reassuring.  Findings consistent with UTI.  Patient is prescribed antibiotics.  Return precautions discussed with the patient.  Follow-up primary care as needed..  Patient is given ED precautions to return to the ED for any worsening or new symptoms.        FINAL CLINICAL IMPRESSION(S) / ED DIAGNOSES   Final diagnoses:  Lower urinary tract infectious disease     Rx / DC Orders   ED Discharge Orders          Ordered    cephALEXin (KEFLEX) 500 MG capsule  2 times daily        12/19/21 1539    phenazopyridine (PYRIDIUM) 100 MG tablet  3 times daily PRN        12/19/21 1539             Note:  This document was prepared using Dragon voice recognition software and may include unintentional dictation errors.   Darletta Moll, PA-C 12/19/21 1557    Lavonia Drafts, MD 12/19/21 [phone]

## 2021-12-19 NOTE — ED Provider Triage Note (Signed)
Emergency Medicine Provider Triage Evaluation Note  Betty Allen , a 21 y.o. female  was evaluated in triage.  Pt complains of burning with urination, denies vaginal discharge.  Review of Systems  Positive:  Negative:   Physical Exam  There were no vitals taken for this visit. Gen:   Awake, no distress   Resp:  Normal effort  MSK:   Moves extremities without difficulty  Other:    Medical Decision Making  Medically screening exam initiated at 2:47 PM.  Appropriate orders placed.  Afsa D Laning was informed that the remainder of the evaluation will be completed by another provider, this initial triage assessment does not replace that evaluation, and the importance of remaining in the ED until their evaluation is complete.     Versie Starks, PA-C 12/19/21 1447

## 2021-12-19 NOTE — ED Triage Notes (Signed)
Pt presents to the ED via POV due to a possible UTI. Pt states she having urgency. Pt denies other urinary symptoms. Pt A&Ox4

## 2021-12-30 DIAGNOSIS — Z419 Encounter for procedure for purposes other than remedying health state, unspecified: Secondary | ICD-10-CM | POA: Diagnosis not present

## 2022-01-30 DIAGNOSIS — Z419 Encounter for procedure for purposes other than remedying health state, unspecified: Secondary | ICD-10-CM | POA: Diagnosis not present

## 2022-03-02 DIAGNOSIS — Z419 Encounter for procedure for purposes other than remedying health state, unspecified: Secondary | ICD-10-CM | POA: Diagnosis not present

## 2022-03-21 ENCOUNTER — Encounter: Payer: Self-pay | Admitting: Emergency Medicine

## 2022-03-21 ENCOUNTER — Other Ambulatory Visit: Payer: Self-pay

## 2022-03-21 ENCOUNTER — Emergency Department
Admission: EM | Admit: 2022-03-21 | Discharge: 2022-03-21 | Disposition: A | Discharge status: Home / Self Care | Payer: Medicaid Other | Attending: Emergency Medicine | Admitting: Emergency Medicine

## 2022-03-21 DIAGNOSIS — B9789 Other viral agents as the cause of diseases classified elsewhere: Secondary | ICD-10-CM

## 2022-03-21 DIAGNOSIS — J111 Influenza due to unidentified influenza virus with other respiratory manifestations: Secondary | ICD-10-CM

## 2022-03-21 DIAGNOSIS — Z20822 Contact with and (suspected) exposure to covid-19: Secondary | ICD-10-CM | POA: Insufficient documentation

## 2022-03-21 DIAGNOSIS — J101 Influenza due to other identified influenza virus with other respiratory manifestations: Secondary | ICD-10-CM | POA: Insufficient documentation

## 2022-03-21 DIAGNOSIS — R509 Fever, unspecified: Secondary | ICD-10-CM | POA: Diagnosis present

## 2022-03-21 LAB — RESP PANEL BY RT-PCR (RSV, FLU A&B, COVID)  RVPGX2
Influenza A by PCR: POSITIVE — AB
Influenza B by PCR: NEGATIVE
Resp Syncytial Virus by PCR: NEGATIVE
SARS Coronavirus 2 by RT PCR: NEGATIVE

## 2022-03-21 MED ORDER — ONDANSETRON 8 MG PO TBDP: 8.0000 mg | ORAL_TABLET | Freq: Three times a day (TID) | ORAL | 0 refills | Status: DC | PRN

## 2022-03-21 NOTE — ED Provider Notes (Signed)
[facility] Provider Note   Event Date/Time   First MD Initiated Contact with Patient 03/21/22 [phone]     (approximate) History  Fever  HPI Betty Allen is a 22 y.o. female with no stated past medical history presents for 24 hours of fever, body aches, sore throat, and cough.  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  Patient does endorse 1 episode of nausea/vomiting last night but states she has been p.o. tolerant this morning.  Patient has been taking over-the-counter medications with minimal relief of her symptoms. ROS: Patient currently denies any vision changes, tinnitus, difficulty speaking, facial droop, chest pain, shortness of breath, abdominal pain, nausea/vomiting/diarrhea, dysuria, or weakness/numbness/paresthesias in any extremity   Physical Exam  Triage Vital Signs: ED Triage Vitals  Enc Vitals Group     BP 03/21/22 0852 116/75     Pulse Rate 03/21/22 0852 (!) 118     Resp 03/21/22 0852 18     Temp 03/21/22 0852 99.3 F (37.4 C)     Temp src --      SpO2 03/21/22 0852 98 %     Weight 03/21/22 0856 116 lb 13.5 oz (53 kg)     Height 03/21/22 0856 5' 2"$  (1.575 m)     Head Circumference --      Peak Flow --      Pain Score 03/21/22 0850 8     Pain Loc --      Pain Edu? --      Excl. in [place]? --    Most recent vital signs: Vitals:   03/21/22 0852  BP: 116/75  Pulse: (!) 118  Resp: 18  Temp: 99.3 F (37.4 C)  SpO2: 98%   General: Awake, oriented x4. CV:  Good peripheral perfusion.  Resp:  Normal effort.  Abd:  No distention.  Other:  Young adult African-American female laying in bed in no acute distress ED Results / Procedures / Treatments  Labs (all labs ordered are listed, but only abnormal results are displayed) Labs Reviewed  RESP PANEL BY RT-PCR (RSV, FLU A&B, COVID)  RVPGX2 - Abnormal; Notable for the following components:      Result  Value   Influenza A by PCR POSITIVE (*)    All other components within normal limits  PROCEDURES: Critical Care performed: No Procedures MEDICATIONS ORDERED IN ED: Medications - No data to display IMPRESSION / MDM / [place] / ED COURSE  I reviewed the triage vital signs and the nursing notes.                             The patient is on the cardiac monitor to evaluate for evidence of arrhythmia and/or significant heart rate changes. Patient's presentation is most consistent with acute presentation with potential threat to life or bodily function. Presentation most consistent with Viral Syndrome.  Patient has tested positive for influenza A. Based on vitals and exam they are nontoxic and stable for discharge.  Given History and Exam I have a lower suspicion for: Emergent CardioPulmonary causes [such as Acute Asthma or COPD Exacerbation, acute Heart Failure or exacerbation, PE, PTX, atypical ACS, PNA]. Emergent Otolaryngeal causes [such as PTA, RPA, [place], Epiglottitis, EBV].  Regarding Emergent Travel or Immunosuppressive related infectious: I have a low suspicion for acute HIV.  Will provide strict return precautions and instructions  on self-isolation/quarantine and anticipatory guidance.   FINAL CLINICAL IMPRESSION(S) / ED DIAGNOSES   Final diagnoses:  Influenza  Viral respiratory illness   Rx / DC Orders   ED Discharge Orders          Ordered    ondansetron (ZOFRAN-ODT) 8 MG disintegrating tablet  Every 8 hours PRN        03/21/22 0956           Note:  This document was prepared using Dragon voice recognition software and may include unintentional dictation errors.   Naaman Plummer, MD 03/21/22 [phone]

## 2022-03-21 NOTE — ED Triage Notes (Signed)
Pt comes with c/o fever, sore throat and headache. Pt states this all started yesterday.

## 2022-03-31 DIAGNOSIS — Z419 Encounter for procedure for purposes other than remedying health state, unspecified: Secondary | ICD-10-CM | POA: Diagnosis not present

## 2022-06-20 ENCOUNTER — Other Ambulatory Visit: Payer: Self-pay

## 2022-06-20 ENCOUNTER — Emergency Department
Admission: EM | Admit: 2022-06-20 | Discharge: 2022-06-20 | Disposition: A | Discharge status: Home / Self Care | Payer: Medicaid Other | Attending: Emergency Medicine | Admitting: Emergency Medicine

## 2022-06-20 DIAGNOSIS — Z8616 Personal history of COVID-19: Secondary | ICD-10-CM | POA: Insufficient documentation

## 2022-06-20 DIAGNOSIS — L0292 Furuncle, unspecified: Secondary | ICD-10-CM

## 2022-06-20 DIAGNOSIS — N764 Abscess of vulva: Secondary | ICD-10-CM | POA: Diagnosis not present

## 2022-06-20 DIAGNOSIS — K649 Unspecified hemorrhoids: Secondary | ICD-10-CM | POA: Diagnosis not present

## 2022-06-20 DIAGNOSIS — K6289 Other specified diseases of anus and rectum: Secondary | ICD-10-CM | POA: Diagnosis present

## 2022-06-20 MED ORDER — HYDROCORTISONE (PERIANAL) 2.5 % EX CREA: 1.0000 | TOPICAL_CREAM | Freq: Two times a day (BID) | CUTANEOUS | 0 refills | Status: DC

## 2022-06-20 MED ORDER — DOXYCYCLINE MONOHYDRATE 100 MG PO TABS: 100.0000 mg | ORAL_TABLET | Freq: Two times a day (BID) | ORAL | 0 refills | Status: AC

## 2022-06-20 MED ORDER — DOCUSATE SODIUM 100 MG PO CAPS: 100.0000 mg | ORAL_CAPSULE | Freq: Two times a day (BID) | ORAL | 0 refills | Status: AC

## 2022-06-20 MED ORDER — CEPHALEXIN 500 MG PO CAPS: 500.0000 mg | ORAL_CAPSULE | Freq: Four times a day (QID) | ORAL | 0 refills | Status: AC

## 2022-06-20 NOTE — ED Provider Notes (Signed)
[facility] Provider Note    Event Date/Time   First MD Initiated Contact with Patient 06/20/22 1054     (approximate)   History   Abscess   HPI  Betty Allen is a 22 y.o. female who presents today for evaluation of area of swelling near her anus, as well as an irritated hair follicle near her labia.  Patient reports that she noticed both when she tried to shave the area.  She has not noticed any drainage.  She reports that she is still able to move her bowels, though has mild discomfort with doing so.  She has not noticed any bleeding.  She denies vaginal discharge.  She has not had any dysuria.  She is wondering if she has a hemorrhoid.  Patient Active Problem List   Diagnosis Date Noted   IUGR (intrauterine growth restriction) affecting care of mother, third trimester, fetus 1 06/14/2020   COVID-19 affecting pregnancy in second trimester 02/05/20 03/02/2020   Chlamydia infection affecting pregnancy in second trimester 02/27/2020   Supervision of high risk pregnancy, antepartum 02/23/2020   High risk teen pregnancy, antepartum 02/23/2020   Pyelonephritis affecting pregnancy in first trimester 02/23/2020   Late prenatal care at 20 wks 02/23/2020   IUGR (intrauterine growth restriction) affecting care of mother 02/23/2020   Fibroids, submucosal 12/06/2017          Physical Exam   Triage Vital Signs: ED Triage Vitals [06/20/22 1027]  Enc Vitals Group     BP (!) 112/92     Pulse Rate 89     Resp 16     Temp 98.8 F (37.1 C)     Temp Source Oral     SpO2 100 %     Weight 116 lb 13.5 oz (53 kg)     Height 5\' 2"  (1.575 m)     Head Circumference      Peak Flow      Pain Score 9     Pain Loc      Pain Edu?      Excl. in [place]?     Most recent vital signs: Vitals:   06/20/22 1027  BP: (!) 112/92  Pulse: 89  Resp: 16  Temp: 98.8 F (37.1 C)  SpO2: 100%    Physical Exam Vitals and nursing note reviewed.  Constitutional:       General: Awake and alert. No acute distress.    Appearance: Normal appearance. The patient is normal weight.  HENT:     Head: Normocephalic and atraumatic.     Mouth: Mucous membranes are moist.  Eyes:     General: PERRL. Normal EOMs        Right eye: No discharge.        Left eye: No discharge.     Conjunctiva/sclera: Conjunctivae normal.  Cardiovascular:     Rate and Rhythm: Normal rate and regular rhythm.     Pulses: Normal pulses.     Heart sounds: Normal heart sounds Pulmonary:     Effort: Pulmonary effort is normal. No respiratory distress.     Breath sounds: Normal breath sounds.  Abdominal:     Abdomen is soft. There is no abdominal tenderness. No rebound or guarding. No distention. GU: There is a 2 x 2 millimeter irritated follicle to the posterior aspect of her left labia.  There is no surrounding erythema.  There is no swelling.  No swelling of her Bartholin gland. Rectum with 1 x 1  cm hemorrhoid at the 6 o'clock position, nonthrombosed.  No tenderness elsewhere.  No fluctuance noted anywhere.  No erythema.  No open wounds. Musculoskeletal:        General: No swelling. Normal range of motion.     Cervical back: Normal range of motion and neck supple.  Skin:    General: Skin is warm and dry.     Capillary Refill: Capillary refill takes less than 2 seconds.     Findings: No rash.  Neurological:     Mental Status: The patient is awake and alert.      ED Results / Procedures / Treatments   Labs (all labs ordered are listed, but only abnormal results are displayed) Labs Reviewed - No data to display   EKG     RADIOLOGY     PROCEDURES:  Critical Care performed:   Procedures   MEDICATIONS ORDERED IN ED: Medications - No data to display   IMPRESSION / MDM / ASSESSMENT AND PLAN / ED COURSE  I reviewed the triage vital signs and the nursing notes.   Differential diagnosis includes, but is not limited to, abscess, furuncle, carbuncle, pilonidal cyst,  Bartholin gland cyst/abscess, hemorrhoid.  Patient is awake and alert, hemodynamically stable and afebrile.  She is nontoxic in appearance.  She has a small tender hemorrhoid near the 6 o'clock position, does not appear to be thrombosed.  She has no area of fluctuance to suggest abscess.  There is no erythema or induration, no evidence of cellulitis.  Secondly, she has a very small irritated ingrown hair on the posterior aspect of her left labia, this also does not appear to be infected though it is mildly tender to palpation.  For the hemorrhoid, she started on stool softeners and Anusol and instructed to follow-up with outpatient specialist for further management as needed.  As for the furuncle/irritated hair follicle she was started on antibiotics.  There is no swelling or fluctuance to suggest necessity for incision and drainage.  There is no surrounding erythema.  This is not consistent with a Bartholin gland cyst or abscess.  There is no pain out of proportion, no crepitus, no hemodynamic instability, no history of immunosuppression, do not suspect deep space or necrotizing infection.  She has an allergy to penicillin and therefore was treated with Keflex and doxycycline instead.  We discussed return precautions and importance of close outpatient follow-up.  Also discussed importance of keeping this area clean.  Please understands and agrees with plan.  She was discharged in stable condition.   Patient's presentation is most consistent with acute complicated illness / injury requiring diagnostic workup.    FINAL CLINICAL IMPRESSION(S) / ED DIAGNOSES   Final diagnoses:  Furuncle  Hemorrhoids, unspecified hemorrhoid type     Rx / DC Orders   ED Discharge Orders          Ordered    docusate sodium (COLACE) 100 MG capsule  2 times daily        06/20/22 1117    hydrocortisone (ANUSOL-HC) 2.5 % rectal cream  2 times daily        06/20/22 1117    cephALEXin (KEFLEX) 500 MG capsule  4 times  daily        06/20/22 1117    doxycycline (ADOXA) 100 MG tablet  2 times daily        06/20/22 1117             Note:  This document was prepared using Dragon  voice recognition software and may include unintentional dictation errors.   Keturah Shavers 06/20/22 1350    Shaune Pollack, MD 06/20/22 1942

## 2022-06-20 NOTE — Discharge Instructions (Signed)
You may take the antibiotics for the furuncle, and the stool softener and the Anusol for your hemorrhoid.  Please follow-up with your outpatient provider, and the listed provider if your symptoms do not resolve in the next few days to a week.  Please avoid any straining.  Please return for any new, worsening, or change in symptoms or other concerns.

## 2022-06-20 NOTE — ED Triage Notes (Signed)
Pt here with a possible abscess near her rectum. Pt states it popped up after she shaved. Pt denies pain with urination.

## 2022-06-20 NOTE — ED Notes (Signed)
Patients state she shave her lower bottom  and notice she started having pain at the anus area. Noted a little swelling and possibly a little tear at area she is hurting at.

## 2023-03-22 ENCOUNTER — Encounter: Payer: Self-pay | Admitting: *Deleted

## 2023-03-22 ENCOUNTER — Observation Stay
Admission: EM | Admit: 2023-03-22 | Discharge: 2023-03-23 | Disposition: A | Discharge status: Home / Self Care | Payer: Medicaid Other | Attending: Obstetrics and Gynecology | Admitting: Obstetrics and Gynecology

## 2023-03-22 ENCOUNTER — Emergency Department: Payer: Medicaid Other

## 2023-03-22 ENCOUNTER — Other Ambulatory Visit: Payer: Self-pay

## 2023-03-22 DIAGNOSIS — Z7722 Contact with and (suspected) exposure to environmental tobacco smoke (acute) (chronic): Secondary | ICD-10-CM | POA: Diagnosis not present

## 2023-03-22 DIAGNOSIS — R109 Unspecified abdominal pain: Secondary | ICD-10-CM | POA: Diagnosis present

## 2023-03-22 DIAGNOSIS — N83292 Other ovarian cyst, left side: Secondary | ICD-10-CM | POA: Diagnosis not present

## 2023-03-22 DIAGNOSIS — K661 Hemoperitoneum: Principal | ICD-10-CM | POA: Diagnosis present

## 2023-03-22 DIAGNOSIS — O4690 Antepartum hemorrhage, unspecified, unspecified trimester: Secondary | ICD-10-CM

## 2023-03-22 DIAGNOSIS — O209 Hemorrhage in early pregnancy, unspecified: Secondary | ICD-10-CM | POA: Diagnosis not present

## 2023-03-22 DIAGNOSIS — O00102 Left tubal pregnancy without intrauterine pregnancy: Principal | ICD-10-CM | POA: Insufficient documentation

## 2023-03-22 LAB — CBC
HCT: 36.1 % (ref 36.0–46.0)
Hemoglobin: 12.2 g/dL (ref 12.0–15.0)
MCH: 29.2 pg (ref 26.0–34.0)
MCHC: 33.8 g/dL (ref 30.0–36.0)
MCV: 86.4 fL (ref 80.0–100.0)
Platelets: 228 10*3/uL (ref 150–400)
RBC: 4.18 MIL/uL (ref 3.87–5.11)
RDW: 13.2 % (ref 11.5–15.5)
WBC: 10.5 10*3/uL (ref 4.0–10.5)
nRBC: 0 % (ref 0.0–0.2)

## 2023-03-22 LAB — COMPREHENSIVE METABOLIC PANEL
ALT: 9 U/L (ref 0–44)
AST: 17 U/L (ref 15–41)
Albumin: 4.4 g/dL (ref 3.5–5.0)
Alkaline Phosphatase: 36 U/L — ABNORMAL LOW (ref 38–126)
Anion gap: 13 (ref 5–15)
BUN: 9 mg/dL (ref 6–20)
CO2: 20 mmol/L — ABNORMAL LOW (ref 22–32)
Calcium: 9.2 mg/dL (ref 8.9–10.3)
Chloride: 101 mmol/L (ref 98–111)
Creatinine, Ser: 0.65 mg/dL (ref 0.44–1.00)
GFR, Estimated: >60 mL/min (ref >60)
Glucose, Bld: 106 mg/dL — ABNORMAL HIGH (ref 70–99)
Potassium: 3.1 mmol/L — ABNORMAL LOW (ref 3.5–5.1)
Sodium: 134 mmol/L — ABNORMAL LOW (ref 135–145)
Total Bilirubin: 1.5 mg/dL — ABNORMAL HIGH (ref 0.0–1.2)
Total Protein: 7.4 g/dL (ref 6.5–8.1)

## 2023-03-22 LAB — HCG, QUANTITATIVE, PREGNANCY: hCG, Beta Chain, Quant, S: 6066 m[IU]/mL — ABNORMAL HIGH (ref <5)

## 2023-03-22 LAB — LIPASE, BLOOD: Lipase: 34 U/L (ref 11–51)

## 2023-03-22 MED ORDER — MORPHINE SULFATE (PF) 4 MG/ML IV SOLN
4.0000 mg | INTRAVENOUS | Status: DC | PRN
  Administered 2023-03-23: 4 mg via INTRAVENOUS
  Filled 2023-03-22: qty 1

## 2023-03-22 MED ORDER — MORPHINE SULFATE (PF) 4 MG/ML IV SOLN
4.0000 mg | Freq: Once | INTRAVENOUS | Status: AC
  Administered 2023-03-22: 4 mg via INTRAVENOUS
  Filled 2023-03-22: qty 1

## 2023-03-22 MED ORDER — LACTATED RINGERS IV BOLUS
1000.0000 mL | Freq: Once | INTRAVENOUS | Status: AC
  Administered 2023-03-22: 1000 mL via INTRAVENOUS

## 2023-03-22 MED ORDER — ONDANSETRON HCL 4 MG/2ML IJ SOLN
4.0000 mg | INTRAMUSCULAR | Status: AC
  Administered 2023-03-23: 4 mg via INTRAVENOUS
  Filled 2023-03-22: qty 2

## 2023-03-22 MED ORDER — FENTANYL CITRATE PF 50 MCG/ML IJ SOSY
50.0000 ug | PREFILLED_SYRINGE | Freq: Once | INTRAMUSCULAR | Status: AC
  Administered 2023-03-22: 50 ug via INTRAVENOUS
  Filled 2023-03-22: qty 1

## 2023-03-22 NOTE — ED Triage Notes (Signed)
Pt to triage via wheelchair.  Pt reports low abd pain and vaginal bleeding.  Pt had a period this month and started bleeding again 3 days ago. Pt has lower back pain.  No v/d  pt tearful.

## 2023-03-22 NOTE — ED Provider Notes (Signed)
[facility] Provider Note    Event Date/Time   First MD Initiated Contact with Patient 03/22/23 2255     (approximate)   History   Abdominal Pain and Vaginal Bleeding   HPI  Betty Allen is a G6, P2 presenting to the emergency department for evaluation of vaginal bleeding.  Had a menstrual cycle earlier this month, but 3 days ago had onset of vaginal bleeding, less than a normal menstrual cycle.  Earlier today had onset of worsening lower abdominal pain leading her to present to the ER.  Denies history of similar abdominal pain.  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    Physical Exam   Triage Vital Signs: ED Triage Vitals  Encounter Vitals Group     BP 03/22/23 2002 119/76     Systolic BP Percentile --      Diastolic BP Percentile --      Pulse Rate 03/22/23 2002 92     Resp 03/22/23 2002 18     Temp 03/22/23 2002 98.5 F (36.9 C)     Temp Source 03/22/23 2002 Oral     SpO2 03/22/23 2005 100 %     Weight 03/22/23 2002 110 lb (49.9 kg)     Height 03/22/23 2002 5\' 2"  (1.575 m)     Head Circumference --      Peak Flow --      Pain Score 03/22/23 2002 10     Pain Loc --      Pain Education --      Exclude from Growth Chart --     Most recent vital signs: Vitals:   03/22/23 2100 03/22/23 2130  BP: 117/75 117/80  Pulse: 85 88  Resp:    Temp:    SpO2: 100% 100%     General: Awake, interactive, appears uncomfortable CV:  Regular rate, good peripheral perfusion.  Resp:  Unlabored respirations.  Abd:  Nondistended, soft, tender to palpation most notably in the left pelvic area Neuro:  Symmetric facial movement, fluid speech   ED Results / Procedures / Treatments   Labs (all labs ordered are listed, but only abnormal results are displayed) Labs Reviewed  COMPREHENSIVE METABOLIC PANEL - Abnormal; Notable for the following components:      Result Value   Sodium 134 (*)    Potassium 3.1 (*)    CO2 20 (*)    Glucose,  Bld 106 (*)    Alkaline Phosphatase 36 (*)    Total Bilirubin 1.5 (*)    All other components within normal limits  HCG, QUANTITATIVE, PREGNANCY - Abnormal; Notable for the following components:   hCG, Beta Chain, Quant, S 6,066 (*)    All other components within normal limits  LIPASE, BLOOD  CBC  URINALYSIS, ROUTINE W REFLEX MICROSCOPIC  POC URINE PREG, ED  ABO/RH     EKG EKG independently reviewed interpreted by myself (ER attending) demonstrates:    RADIOLOGY Imaging independently reviewed and interpreted by myself demonstrates:  Ultrasound for evaluation of ectopic and torsion pending  PROCEDURES:  Critical Care performed: No  Procedures   MEDICATIONS ORDERED IN ED: Medications  morphine (PF) 4 MG/ML injection 4 mg (4 mg Intravenous Given 03/22/23 2031)  fentaNYL (SUBLIMAZE) injection 50 mcg (50 mcg Intravenous Given 03/22/23 2153)     IMPRESSION / MDM / ASSESSMENT AND PLAN / ED COURSE  I reviewed the triage vital signs and the nursing notes.  Differential diagnosis includes, but is not limited  to, ovarian torsion, ectopic pregnancy, fibroid associated pain, ovarian cyst rupture  Patient's presentation is most consistent with acute presentation with potential threat to life or bodily function.  23 year old female presenting with left lower abdominal pain and vaginal bleeding.  Stable vitals on presentation, but with active pain. Patient treated with morphine with ongoing pain, so ordered for fentanyl. Labs without critical derangement.  hCG did return positive at 6000, so ultrasound was changed for any torsion evaluation to rule out ectopic as well.  Signed out to oncoming physician at 2300 pending ultrasound, reevaluation, and disposition.     FINAL CLINICAL IMPRESSION(S) / ED DIAGNOSES   Final diagnoses:  Vaginal bleeding in pregnancy     Rx / DC Orders   ED Discharge Orders     None        Note:  This document was prepared using Dragon voice  recognition software and may include unintentional dictation errors.   Trinna Post, MD 03/22/23 2322

## 2023-03-22 NOTE — ED Provider Notes (Signed)
-----------------------------------------   10:56 PM on 03/22/2023 -----------------------------------------  Assuming care from Dr. Rosalia Hammers.  In short, Betty Allen is a 23 y.o. female with a chief complaint of abdominal pain, now in early pregnancy.  Refer to the original H&P for additional details.  The current plan of care is to follow up ultrasound and reassess.   Clinical Course as of 03/22/23 2353  Thu Mar 22, 2023  2337 Radiologist called to inform us that the ultrasound is consistent with ruptured ectopic pregnancy.  I viewed and interpreted the ultrasound and can see the area of hemoperitoneum and it seems to be consolidating around the left ovary.  I consulted by phone with Dr. Jean Rosenthal with OB/GYN and informed him of the emergent situation.  He will come to the emergency department to see the patient.  I spoke with the patient.  She is in a considerable amount of pain right now, reporting her pain is a 7 out of 10.  I ordered morphine and Zofran as needed, ordered LR 1 L IV fluid bolus, and instructed her to remain n.p.o. (she is wanting to drink water).  I explained the probable diagnosis and the need for OB/GYN evaluation and possible surgery. [CF]  2345 Patient remains hemodynamically stable at this time. [CF]  2346 Type and screen is pending, but I looked through the medical record and see that she had a prior type and screen in 2022 indicating that she is B+ blood type [CF]    Clinical Course User Index [CF] Loleta Rose, MD   .1-3 Lead EKG Interpretation  Performed by: Loleta Rose, MD Authorized by: Loleta Rose, MD     Interpretation: normal     ECG rate:  88   ECG rate assessment: normal     Rhythm: sinus rhythm     Ectopy: none     Conduction: normal   .Critical Care  Performed by: Loleta Rose, MD Authorized by: Loleta Rose, MD   Critical care provider statement:    Critical care time (minutes):  30   Critical care time was exclusive of:  Separately  billable procedures and treating other patients   Critical care was necessary to treat or prevent imminent or life-threatening deterioration of the following conditions: ruptured ectopic pregnancy with hemopertonium.   Critical care was time spent personally by me on the following activities:  Development of treatment plan with patient or surrogate, evaluation of patient's response to treatment, examination of patient, obtaining history from patient or surrogate, ordering and performing treatments and interventions, ordering and review of laboratory studies, ordering and review of radiographic studies, pulse oximetry, re-evaluation of patient's condition and review of old charts    Medications  morphine (PF) 4 MG/ML injection 4 mg (has no administration in time range)  ondansetron (ZOFRAN) injection 4 mg (has no administration in time range)  morphine (PF) 4 MG/ML injection 4 mg (4 mg Intravenous Given 03/22/23 2031)  fentaNYL (SUBLIMAZE) injection 50 mcg (50 mcg Intravenous Given 03/22/23 2153)  lactated ringers bolus 1,000 mL (1,000 mLs Intravenous New Bag/Given 03/22/23 2346)     ED Discharge Orders     None      Final diagnoses:  Vaginal bleeding in pregnancy  Ruptured left tubal ectopic pregnancy causing hemoperitoneum     Loleta Rose, MD 03/23/23 [phone]

## 2023-03-23 ENCOUNTER — Emergency Department: Payer: Medicaid Other | Admitting: Anesthesiology

## 2023-03-23 ENCOUNTER — Encounter
Admission: EM | Disposition: A | Discharge status: Home / Self Care | Payer: Self-pay | Source: Home / Self Care | Attending: Emergency Medicine

## 2023-03-23 ENCOUNTER — Other Ambulatory Visit: Payer: Self-pay

## 2023-03-23 DIAGNOSIS — K661 Hemoperitoneum: Principal | ICD-10-CM | POA: Diagnosis present

## 2023-03-23 HISTORY — PX: XI ROBOTIC ASSISTED SALPINGECTOMY: SHX6824

## 2023-03-23 HISTORY — PX: ROBOTIC ASSISTED LAPAROSCOPIC OVARIAN CYSTECTOMY: SHX6081

## 2023-03-23 LAB — TYPE AND SCREEN
ABO/RH(D): B POS
Antibody Screen: NEGATIVE

## 2023-03-23 SURGERY — SALPINGECTOMY, ROBOT-ASSISTED
Anesthesia: General | Site: Abdomen | Laterality: Left

## 2023-03-23 MED ORDER — KETOROLAC TROMETHAMINE 30 MG/ML IJ SOLN
INTRAMUSCULAR | Status: AC
  Filled 2023-03-23: qty 1

## 2023-03-23 MED ORDER — HYDROCODONE-ACETAMINOPHEN 5-325 MG PO TABS: 2.0000 | ORAL_TABLET | Freq: Four times a day (QID) | ORAL | Status: DC | PRN

## 2023-03-23 MED ORDER — SIMETHICONE 80 MG PO CHEW
80.0000 mg | CHEWABLE_TABLET | Freq: Four times a day (QID) | ORAL | Status: DC | PRN
  Administered 2023-03-23: 80 mg via ORAL
  Filled 2023-03-23: qty 1

## 2023-03-23 MED ORDER — PHENYLEPHRINE 80 MCG/ML (10ML) SYRINGE FOR IV PUSH (FOR BLOOD PRESSURE SUPPORT)
PREFILLED_SYRINGE | INTRAVENOUS | Status: AC
  Filled 2023-03-23: qty 10

## 2023-03-23 MED ORDER — MENTHOL 3 MG MT LOZG: 1.0000 | LOZENGE | OROMUCOSAL | Status: DC | PRN

## 2023-03-23 MED ORDER — ALBUMIN HUMAN 5 % IV SOLN: INTRAVENOUS | Status: DC | PRN

## 2023-03-23 MED ORDER — ONDANSETRON HCL 4 MG PO TABS: 4.0000 mg | ORAL_TABLET | Freq: Four times a day (QID) | ORAL | Status: DC | PRN

## 2023-03-23 MED ORDER — FENTANYL CITRATE (PF) 100 MCG/2ML IJ SOLN
INTRAMUSCULAR | Status: AC
  Filled 2023-03-23: qty 2

## 2023-03-23 MED ORDER — FENTANYL CITRATE (PF) 100 MCG/2ML IJ SOLN
INTRAMUSCULAR | Status: DC | PRN
  Administered 2023-03-23: 25 ug via INTRAVENOUS
  Administered 2023-03-23: 50 ug via INTRAVENOUS
  Administered 2023-03-23: 25 ug via INTRAVENOUS

## 2023-03-23 MED ORDER — DROPERIDOL 2.5 MG/ML IJ SOLN: 0.6250 mg | Freq: Once | INTRAMUSCULAR | Status: DC | PRN

## 2023-03-23 MED ORDER — ONDANSETRON HCL 4 MG/2ML IJ SOLN: 4.0000 mg | Freq: Four times a day (QID) | INTRAMUSCULAR | Status: DC | PRN

## 2023-03-23 MED ORDER — ACETAMINOPHEN 10 MG/ML IV SOLN
INTRAVENOUS | Status: DC | PRN
  Administered 2023-03-23: 750 mg via INTRAVENOUS

## 2023-03-23 MED ORDER — GLYCOPYRROLATE 0.2 MG/ML IJ SOLN
INTRAMUSCULAR | Status: DC | PRN
  Administered 2023-03-23: .2 mg via INTRAVENOUS

## 2023-03-23 MED ORDER — BUPIVACAINE HCL 0.5 % IJ SOLN
INTRAMUSCULAR | Status: DC | PRN
  Administered 2023-03-23: 7 mL

## 2023-03-23 MED ORDER — OXYCODONE HCL 5 MG/5ML PO SOLN: 5.0000 mg | Freq: Once | ORAL | Status: AC | PRN

## 2023-03-23 MED ORDER — MIDAZOLAM HCL 2 MG/2ML IJ SOLN
INTRAMUSCULAR | Status: AC
  Filled 2023-03-23: qty 2

## 2023-03-23 MED ORDER — ONDANSETRON HCL 4 MG/2ML IJ SOLN
INTRAMUSCULAR | Status: DC | PRN
  Administered 2023-03-23: 4 mg via INTRAVENOUS

## 2023-03-23 MED ORDER — DEXAMETHASONE SODIUM PHOSPHATE 10 MG/ML IJ SOLN
INTRAMUSCULAR | Status: AC
  Filled 2023-03-23: qty 1

## 2023-03-23 MED ORDER — ACETAMINOPHEN 10 MG/ML IV SOLN
INTRAVENOUS | Status: AC
  Filled 2023-03-23: qty 100

## 2023-03-23 MED ORDER — PROPOFOL 10 MG/ML IV BOLUS
INTRAVENOUS | Status: DC | PRN
  Administered 2023-03-23: 150 mg via INTRAVENOUS
  Administered 2023-03-23: 30 mg via INTRAVENOUS
  Administered 2023-03-23: 100 ug/kg/min via INTRAVENOUS

## 2023-03-23 MED ORDER — ALBUMIN HUMAN 5 % IV SOLN
INTRAVENOUS | Status: AC
  Filled 2023-03-23: qty 250

## 2023-03-23 MED ORDER — DEXAMETHASONE SODIUM PHOSPHATE 10 MG/ML IJ SOLN
INTRAMUSCULAR | Status: DC | PRN
  Administered 2023-03-23: 10 mg via INTRAVENOUS

## 2023-03-23 MED ORDER — KETOROLAC TROMETHAMINE 30 MG/ML IJ SOLN: INTRAMUSCULAR | Status: DC | PRN

## 2023-03-23 MED ORDER — SUGAMMADEX SODIUM 200 MG/2ML IV SOLN
INTRAVENOUS | Status: DC | PRN
  Administered 2023-03-23: 100 mg via INTRAVENOUS

## 2023-03-23 MED ORDER — DOCUSATE SODIUM 100 MG PO CAPS
100.0000 mg | ORAL_CAPSULE | Freq: Two times a day (BID) | ORAL | Status: DC
  Administered 2023-03-23: 100 mg via ORAL
  Filled 2023-03-23: qty 1

## 2023-03-23 MED ORDER — OXYCODONE HCL 5 MG PO TABS
5.0000 mg | ORAL_TABLET | Freq: Once | ORAL | Status: AC | PRN
  Administered 2023-03-23: 5 mg

## 2023-03-23 MED ORDER — PROPOFOL 10 MG/ML IV BOLUS
INTRAVENOUS | Status: AC
  Filled 2023-03-23: qty 20

## 2023-03-23 MED ORDER — OXYCODONE HCL 5 MG PO TABS
ORAL_TABLET | ORAL | Status: AC
  Filled 2023-03-23: qty 1

## 2023-03-23 MED ORDER — KETOROLAC TROMETHAMINE 30 MG/ML IJ SOLN
INTRAMUSCULAR | Status: DC | PRN
Start: 2023-03-23 — End: 2023-03-23
  Administered 2023-03-23: 15 mg via INTRAVENOUS

## 2023-03-23 MED ORDER — PHENYLEPHRINE 80 MCG/ML (10ML) SYRINGE FOR IV PUSH (FOR BLOOD PRESSURE SUPPORT)
PREFILLED_SYRINGE | INTRAVENOUS | Status: DC | PRN
  Administered 2023-03-23: 80 ug via INTRAVENOUS

## 2023-03-23 MED ORDER — LIDOCAINE HCL (PF) 2 % IJ SOLN
INTRAMUSCULAR | Status: AC
  Filled 2023-03-23: qty 5

## 2023-03-23 MED ORDER — SUGAMMADEX SODIUM 200 MG/2ML IV SOLN
INTRAVENOUS | Status: AC
  Filled 2023-03-23: qty 2

## 2023-03-23 MED ORDER — MIDAZOLAM HCL 2 MG/2ML IJ SOLN
INTRAMUSCULAR | Status: DC | PRN
  Administered 2023-03-23: 2 mg via INTRAVENOUS

## 2023-03-23 MED ORDER — ONDANSETRON HCL 4 MG/2ML IJ SOLN
INTRAMUSCULAR | Status: AC
  Filled 2023-03-23: qty 2

## 2023-03-23 MED ORDER — HYDROCODONE-ACETAMINOPHEN 5-325 MG PO TABS: 1.0000 | ORAL_TABLET | Freq: Four times a day (QID) | ORAL | 0 refills | Status: AC | PRN

## 2023-03-23 MED ORDER — ROCURONIUM BROMIDE 100 MG/10ML IV SOLN
INTRAVENOUS | Status: DC | PRN
  Administered 2023-03-23: 50 mg via INTRAVENOUS

## 2023-03-23 MED ORDER — LACTATED RINGERS IV SOLN: INTRAVENOUS | Status: AC

## 2023-03-23 MED ORDER — BUPIVACAINE HCL (PF) 0.5 % IJ SOLN
INTRAMUSCULAR | Status: AC
  Filled 2023-03-23: qty 30

## 2023-03-23 MED ORDER — IBUPROFEN 600 MG PO TABS: 600.0000 mg | ORAL_TABLET | Freq: Four times a day (QID) | ORAL | 0 refills | Status: AC

## 2023-03-23 MED ORDER — LACTATED RINGERS IR SOLN
Status: DC | PRN
  Administered 2023-03-23: 300 mL

## 2023-03-23 MED ORDER — 0.9 % SODIUM CHLORIDE (POUR BTL) OPTIME
TOPICAL | Status: DC | PRN
  Administered 2023-03-23: 10 mL

## 2023-03-23 MED ORDER — SUCCINYLCHOLINE CHLORIDE 200 MG/10ML IV SOSY
PREFILLED_SYRINGE | INTRAVENOUS | Status: AC
  Filled 2023-03-23: qty 10

## 2023-03-23 MED ORDER — LACTATED RINGERS IV SOLN: INTRAVENOUS | Status: DC

## 2023-03-23 MED ORDER — HEMOSTATIC AGENTS (NO CHARGE) OPTIME
TOPICAL | Status: DC | PRN
  Administered 2023-03-23: 1 via TOPICAL

## 2023-03-23 MED ORDER — SUCCINYLCHOLINE CHLORIDE 200 MG/10ML IV SOSY
PREFILLED_SYRINGE | INTRAVENOUS | Status: DC | PRN
  Administered 2023-03-23: 80 mg via INTRAVENOUS

## 2023-03-23 MED ORDER — IBUPROFEN 600 MG PO TABS
600.0000 mg | ORAL_TABLET | Freq: Four times a day (QID) | ORAL | Status: DC
  Administered 2023-03-23: 600 mg via ORAL
  Filled 2023-03-23: qty 1

## 2023-03-23 MED ORDER — LIDOCAINE HCL (CARDIAC) PF 100 MG/5ML IV SOSY
PREFILLED_SYRINGE | INTRAVENOUS | Status: DC | PRN
  Administered 2023-03-23: 80 mg via INTRAVENOUS

## 2023-03-23 MED ORDER — HYDROCODONE-ACETAMINOPHEN 5-325 MG PO TABS
1.0000 | ORAL_TABLET | Freq: Four times a day (QID) | ORAL | Status: DC | PRN
  Administered 2023-03-23: 1 via ORAL
  Filled 2023-03-23 (×2): qty 1

## 2023-03-23 MED ORDER — ROCURONIUM BROMIDE 10 MG/ML (PF) SYRINGE
PREFILLED_SYRINGE | INTRAVENOUS | Status: AC
  Filled 2023-03-23: qty 10

## 2023-03-23 MED ORDER — FENTANYL CITRATE (PF) 100 MCG/2ML IJ SOLN
25.0000 ug | INTRAMUSCULAR | Status: DC | PRN
  Administered 2023-03-23: 25 ug via INTRAVENOUS

## 2023-03-23 MED ORDER — LACTATED RINGERS IV SOLN: INTRAVENOUS | Status: DC | PRN

## 2023-03-23 SURGICAL SUPPLY — 52 items
ANCHOR TIS RET SYS 235ML (MISCELLANEOUS) IMPLANT
BAG URINE DRAIN 2000ML AR STRL (UROLOGICAL SUPPLIES) ×2 IMPLANT
CATH URTH 16FR FL 2W BLN LF (CATHETERS) ×2 IMPLANT
COVER TIP SHEARS 8 DVNC (MISCELLANEOUS) ×2 IMPLANT
COVER WAND RF STERILE (DRAPES) ×2 IMPLANT
DERMABOND ADVANCED .7 DNX12 (GAUZE/BANDAGES/DRESSINGS) ×2 IMPLANT
DRAPE ARM DVNC X/XI (DISPOSABLE) ×6 IMPLANT
DRAPE COLUMN DVNC XI (DISPOSABLE) ×2 IMPLANT
DRAPE ROBOT W/ LEGGING 30X125 (DRAPES) ×2 IMPLANT
DRAPE SHEET LG 3/4 BI-LAMINATE (DRAPES) ×2 IMPLANT
DRAPE UNDER BUTTOCK W/FLU (DRAPES) ×2 IMPLANT
DRESSING SURGICEL FIBRLLR 1X2 (HEMOSTASIS) IMPLANT
DRSG SURGICEL FIBRILLAR 1X2 (HEMOSTASIS) ×2 IMPLANT
ELECT REM PT RETURN 9FT ADLT (ELECTROSURGICAL) ×2 IMPLANT
ELECTRODE REM PT RTRN 9FT ADLT (ELECTROSURGICAL) ×2 IMPLANT
FORCEPS BPLR 8 MD DVNC XI (FORCEP) ×2 IMPLANT
FORCEPS BPLR R/ABLATION 8 DVNC (INSTRUMENTS) ×2 IMPLANT
GLOVE BIO SURGEON STRL SZ7 (GLOVE) ×4 IMPLANT
GLOVE BIOGEL PI IND STRL 7.5 (GLOVE) ×6 IMPLANT
GOWN STRL REUS W/ TWL LRG LVL3 (GOWN DISPOSABLE) ×6 IMPLANT
GRASPER SUT TROCAR 14GX15 (MISCELLANEOUS) IMPLANT
IRRIGATION STRYKERFLOW (MISCELLANEOUS) IMPLANT
IRRIGATOR STRYKERFLOW (MISCELLANEOUS) IMPLANT
IRRIGATOR SUCT 8 DISP DVNC XI (IRRIGATION / IRRIGATOR) IMPLANT
IV NS 1000ML BAXH (IV SOLUTION) ×2 IMPLANT
KIT PINK PAD W/HEAD ARE REST (MISCELLANEOUS) ×2 IMPLANT
KIT PINK PAD W/HEAD ARM REST (MISCELLANEOUS) ×2 IMPLANT
LABEL OR SOLS (LABEL) ×2 IMPLANT
MANIFOLD NEPTUNE II (INSTRUMENTS) ×2 IMPLANT
NS IRRIG 1000ML POUR BTL (IV SOLUTION) ×2 IMPLANT
OBTURATOR OPTICAL STND 8 DVNC (TROCAR) ×2 IMPLANT
OBTURATOR OPTICALSTD 8 DVNC (TROCAR) ×2 IMPLANT
PACK LAP CHOLECYSTECTOMY (MISCELLANEOUS) ×2 IMPLANT
PAD OB MATERNITY 11 LF (PERSONAL CARE ITEMS) ×2 IMPLANT
PAD PREP OB/GYN DISP 24X41 (PERSONAL CARE ITEMS) ×2 IMPLANT
SCISSORS MNPLR CVD DVNC XI (INSTRUMENTS) ×2 IMPLANT
SCRUB CHG 4% DYNA-HEX 4OZ (MISCELLANEOUS) ×2 IMPLANT
SEAL UNIV 5-12 XI (MISCELLANEOUS) ×6 IMPLANT
SEALER VESSEL EXT DVNC XI (MISCELLANEOUS) IMPLANT
SOL ELECTROSURG ANTI STICK (MISCELLANEOUS) ×2 IMPLANT
SOLUTION ELECTROSURG ANTI STCK (MISCELLANEOUS) ×2 IMPLANT
SPONGE T-LAP 18X18 ~~LOC~~+RFID (SPONGE) IMPLANT
SURGILUBE 2OZ TUBE FLIPTOP (MISCELLANEOUS) ×2 IMPLANT
SUT MNCRL 4-0 27 PS-2 XMFL (SUTURE) ×2 IMPLANT
SUT STRATA 2-0 23CM CT-2 (SUTURE) IMPLANT
SUT VICRYL 0 UR6 27IN ABS (SUTURE) IMPLANT
SUTURE MNCRL 4-0 27XMF (SUTURE) ×2 IMPLANT
SYR 10ML LL (SYRINGE) ×2 IMPLANT
TOWEL OR 17X26 4PK STRL BLUE (TOWEL DISPOSABLE) ×2 IMPLANT
TRAP FLUID SMOKE EVACUATOR (MISCELLANEOUS) ×2 IMPLANT
TUBING EVAC SMOKE HEATED PNEUM (TUBING) ×2 IMPLANT
WATER STERILE IRR 500ML POUR (IV SOLUTION) ×2 IMPLANT

## 2023-03-23 NOTE — Op Note (Signed)
Operative Note    Name: Betty Allen  Date of Service: 03/23/2023  DOB: [birth year]  MRN: [id number]   Pre-Operative Diagnosis:  1) Ruptured left tubal ectopic pregnancy causing hemoperitoneum 2) Left ovarian cyst  Post-Operative Diagnosis:  1) Ruptured left tubal ectopic pregnancy causing hemoperitoneum 2) Left ovarian cyst  Procedures:  1) Robot assisted left salpingectomy, removal of ectopic pregnancy 2) Left ovarian cystectomy 3) Evacuation of hemoperitoneum  Primary Surgeon: Thomasene Mohair, MD   EBL: 250 mL  (about 225 mL hemoperitoneum included in this number)  IVF: 1,000 mL   Urine output: 300 mL  Specimens:  1) Left fallopian tube with ectopic pregnancy 2) Left ovarian cyst wall  Drains: none  Complications: None   Disposition: PACU   Condition: Stable   Findings:  1) Hemoperitoneum, as noted above 2) Enlarged and bleeding left fallopian tube 3) Simple-appearing left ovarian cyst 4) Normal appearing right fallopian tube, uterus, and right ovary  Procedure Summary:  The patient was taken to the operating room where general anesthesia was administered and found to be adequate. She was placed in the dorsal supine lithotomy position in [place] stirrups and prepped and draped in usual sterile fashion. After a timeout was called an indwelling catheter was placed in her bladder.  A sponge on a stick was placed in her vagina for uterine manipulation.  Attention was turned to the abdomen where after injection of local anesthetic, an 8 mm infraumbilical incision was made with the scalpel. Entry into the abdomen was obtained via Optiview trocar technique (a blunt entry technique with camera visualization through the obturator upon entry). Verification of entry into the abdomen was obtained using opening pressures. The abdomen was insufflated with CO2. The camera was introduced through the trocar with verification of atraumatic entry.  Right and left abdominal entry  sites were created after injection of local anesthetic about 8 cm away from the umbilical port in accordance with the Intuitive manufacturer's recommendations.  The port sites were 8 mm.  The intuitive trochars were introduced under intra-abdominal camera visualization without difficulty.  The XI robot was docked on the patient's left.  Clearance was verified from the patient's legs.  Through the umbilical port the camera was placed.  Through the port attached to arm 4 the fenestrated bipolar forceps were placed.  Through the port attached to arm 2 the vessel sealer was placed.   The left fallopian tube was inspected and elevated.  Finding the plane between the ovary and the left mesosalpinx the vessel sealer was used to cauterize and transect the mesosalpinx and a lateral to medial fashion.  The left fallopian tube at the cornu was cauterized and transected using the vessel sealer.  The specimen was placed in the anterior cul-de-sac.  The abdomen and pelvis were then emptied of all visible blood.  The left ovary notably had a cyst which was opened and had clear fluid.  The cyst was approximately 4 cm in size.  There was some bleeding noted from the edges of the cyst opening.  So, the cyst wall was peeled away from the ovary and removed from the body cavity.  The cyst wall bed was made hemostatic with cautery and the placement of fibrillar.  The pressure in the abdomen was lowered to 5 mmHg with hemostasis noted.  The instruments were removed and the robot was undocked.  The robot was moved from the patient bedside.  The infraumbilical incision was extended under direct intra-abdominal camera visualization and replaced with  a 12 mm trocar.  The specimen retrieval bag was passed through the infraumbilical trocar and the specimen was placed into the bag.  The specimen was removed intact and the bag.  The trocar was pulled from the infraumbilical incision.  A figure-of-eight stitch was thrown using 0 Vicryl to  reapproximate the fascia at this trocar site.  The abdomen was emptied of CO2 with the aid of 5 deep breaths from anesthesia.  The remaining 2 trocars were removed.  The skin was closed at all incision sites using 4-0 Monocryl in a subcuticular fashion.  Surgical skin glue was placed over all incisions to reinforce the skin closure.  The sponge stick was removed from the vagina.  The Foley catheter was removed.  A digital sweep of the vagina was performed to ensure that the vagina was clear of all instruments and sponges.  The vagina was found to be clear.  The patient tolerated the procedure well.  Sponge, lap, needle, and instrument counts were correct x 2.  VTE prophylaxis: SCDs. Antibiotic prophylaxis: none indicated. She was awakened in the operating room and was taken to the PACU in stable condition.   Thomasene Mohair, MD 03/23/2023 3:14 AM

## 2023-03-23 NOTE — H&P (Signed)
GYNECOLOGY HISTORY AND PHYSICAL NOTE  History and Physical  Attending Provider: Loleta Rose, MD   PRIM MORACE [id number] 03/23/2023 12:37 AM    Reason for Consultation:   Betty Allen is a 23 y.o. [id number]  female seen at the request of Dr. York Cerise for evaluation of likely ectopic pregancny.    History of Present Ilness:   Patient presents with acutely worsening abdominal pain, mostly on left side.  She had a period about 2 weeks ago. She states that her periods are regular and this period was normal and lasted 4-5 days. She had some spotting with the pain.  She had worsening pain until today when her pain became unbearable.  She notes she has tried using heating pads and Tylenol. Her pain is mostly on her left, but also in her back. The heating pads ease her pain somewhat. Nothing seems to make her pain worse. She rates the pain as 10/10.  She notes associated nausea without emesis.  She has a hard time describing the pain and apologizes for not being able to concentrate enough to describe the pain.   Past Medical History:  Diagnosis Date   Anemia    Fibroid    History of chlamydia    History of gonorrhea    History of prior pregnancy with IUGR newborn    History of pyelonephritis    History of spontaneous abortion    x2   Past Surgical History:  Procedure Laterality Date   denies surgical history     Allergies  Allergen Reactions   Penicillins Rash    Has patient had a PCN reaction causing immediate rash, facial/tongue/throat swelling, SOB or lightheadedness with hypotension: Yes Has patient had a PCN reaction causing severe rash involving mucus membranes or skin necrosis: No Has patient had a PCN reaction that required hospitalization No Has patient had a PCN reaction occurring within the last 10 years: No If all of the above answers are "NO", then may proceed with Cephalosporin use.    Prior to Admission medications: Denies   Obstetric History: She is a  [id number] female s/p SVD x 2.    Social History:  She  reports that she has never smoked. She has been exposed to tobacco smoke. She has never used smokeless tobacco. She reports that she does not currently use alcohol. She reports that she does not currently use drugs after having used the following drugs: Marijuana.  Family History:  family history includes Hypertension in her father, maternal grandmother, paternal grandfather, and paternal grandmother.   Review of Systems  Constitutional: Negative.   HENT: Negative.    Eyes: Negative.   Respiratory: Negative.    Cardiovascular: Negative.   Gastrointestinal:  Positive for abdominal pain and nausea. Negative for blood in stool, constipation, diarrhea, heartburn, melena and vomiting.  Genitourinary: Negative.   Musculoskeletal:  Positive for back pain. Negative for falls, joint pain, myalgias and neck pain.  Skin: Negative.   Neurological: Negative.   Psychiatric/Behavioral: Negative.       Objective    BP 117/80   Pulse 88   Temp 98.5 F (36.9 C) (Oral)   Resp 18   Ht 5\' 2"  (1.575 m)   Wt 49.9 kg   LMP 03/10/2023 (Exact Date)   SpO2 100%   BMI 20.12 kg/m  Physical Exam Constitutional:      General: She is in acute distress.     Appearance: Normal appearance. She is well-developed.     Comments: Writhing  in bed with frequent position changes to try to get comfortable.  Genitourinary:     Genitourinary Comments: Deferred  HENT:     Head: Normocephalic and atraumatic.  Eyes:     General: No scleral icterus.    Conjunctiva/sclera: Conjunctivae normal.  Cardiovascular:     Rate and Rhythm: Normal rate and regular rhythm.     Heart sounds: No murmur heard.    No friction rub. No gallop.  Pulmonary:     Effort: Pulmonary effort is normal. No respiratory distress.     Breath sounds: Normal breath sounds. No wheezing or rales.  Abdominal:     General: Bowel sounds are normal. There is no distension.     Palpations: Abdomen  is soft. There is no mass.     Tenderness: There is abdominal tenderness. There is guarding (voluntary). There is no rebound.  Musculoskeletal:        General: Normal range of motion.     Cervical back: Normal range of motion and neck supple.  Neurological:     General: No focal deficit present.     Mental Status: She is alert and oriented to person, place, and time.     Cranial Nerves: No cranial nerve deficit.  Skin:    General: Skin is warm and dry.     Findings: No erythema.  Psychiatric:        Mood and Affect: Mood normal.        Behavior: Behavior normal.        Judgment: Judgment normal.       Laboratory Results:   Lab Results  Component Value Date   WBC 10.5 03/22/2023   RBC 4.18 03/22/2023   HGB 12.2 03/22/2023   HCT 36.1 03/22/2023   PLT 228 03/22/2023   NA 134 (L) 03/22/2023   K 3.1 (L) 03/22/2023   CREATININE 0.65 03/22/2023   Lab Results  Component Value Date   HCGBETAQNT 6,066 (H) 03/22/2023    Blood type: B positive (from 06/14/2020)  Imaging Results US OB LESS THAN 14 WEEKS W/ OB TRANSVAGINAL AND DOPPLER Result Date: 03/22/2023 CLINICAL DATA:  Left lower quadrant pain with vaginal bleeding EXAM: OBSTETRIC <14 WK [place] AND TRANSVAGINAL OB US DOPPLER ULTRASOUND OF OVARIES TECHNIQUE: Both transabdominal and transvaginal ultrasound examinations were performed for complete evaluation of the gestation as well as the maternal uterus, adnexal regions, and pelvic cul-de-sac. Transvaginal technique was performed to assess early pregnancy. Color and duplex Doppler ultrasound was utilized to evaluate blood flow to the ovaries. COMPARISON:  None Available. FINDINGS: Intrauterine gestational sac: None Yolk sac:  Not Visualized. Embryo:  Not Visualized. Maternal uterus/adnexae: Right ovary measures 2.4 x 2.3 x 1.8 cm. The left ovary measures 6.8 x 4.3 x 6.8 cm and contains a large sonolucent cyst measuring 4.6 x 3.8 x 5.9 cm. Large volume heterogeneous and complex fluid within  the pelvis, asymmetric to left adnexa, consistent with hemoperitoneum Pulsed Doppler evaluation of both ovaries demonstrates normal appearing low-resistance arterial and venous waveforms. IMPRESSION: No IUP identified. Large volume complex fluid in the pelvis and left greater than right adnexa consistent with hemoperitoneum. Constellation of findings are highly concerning for ruptured ectopic pregnancy though the ectopic mass is not directly visualized, potentially due to large volume of hemorrhage in the left adnexa. Critical Value/emergent results were called by telephone at the time of interpretation on 03/22/2023 at 11:30 pm to provider NEHA RAY , who verbally acknowledged these results. Electronically Signed   By: Selena Batten  Jake Samples M.D.   On: 03/22/2023 23:30     Assessment & Plan   Betty Allen is a 23 y.o. [phone]  female with acute onset worsening abdominal pain. History, physical, lab, and imaging findings highly concerning for ectopic pregnancy, likely on the left side.  Discussed findings with patient.  Recommend laparoscopic investigation and removal of ectopic pregnancy which might include the fallopian tube (highly  suspect left side).  She voiced agreement to proceeding with the procedure.  I personally consented the patient regarding the risks and benefits of the procedure. The risks of surgery were discussed in detail with the patient including but not limited to: bleeding which may require transfusion or reoperation; infection which may require antibiotics; injury to surrounding organs which may involve bowel, bladder, ureters ; need for additional procedures including laparoscopy or laparotomy; thromboembolic phenomenon, surgical site problems and other postoperative/anesthesia complications. Likelihood of success in alleviating the patient's condition was discussed. Routine postoperative instructions will be reviewed with the patient and her family in detail after surgery.  The patient  concurred with the proposed plan, giving informed written consent for the surgery.  Patient has been NPO since about 330 PM yesterday (9 hours) and she will remain NPO for procedure.  Anesthesia and OR aware.  Preoperative prophylactic antibiotics, as indicated, and SCDs ordered on call to the OR.  To OR when ready.   Thomasene Mohair, MD 03/23/2023 12:37 AM

## 2023-03-23 NOTE — Anesthesia Preprocedure Evaluation (Addendum)
Anesthesia Evaluation  Patient identified by MRN, date of birth, ID band Patient awake    Reviewed: Allergy & Precautions, H&P , NPO status , Patient's Chart, lab work & pertinent test results  Airway Mallampati: III  TM Distance: >3 FB Neck ROM: full    Dental no notable dental hx.    Pulmonary neg pulmonary ROS   Pulmonary exam normal        Cardiovascular negative cardio ROS Normal cardiovascular exam     Neuro/Psych negative neurological ROS  negative psych ROS   GI/Hepatic negative GI ROS, Neg liver ROS,,,  Endo/Other  negative endocrine ROS    Renal/GU      Musculoskeletal   Abdominal   Peds  Hematology negative hematology ROS (+)   Anesthesia Other Findings Ruptures ectopic- Large volume complex fluid in the pelvis and left greater than right adnexa consistent with hemoperitoneum  Hypokalemia 3.1 Influenza A + 1L bolus at midnight VSS, Hgb normal  Past Medical History: No date: Anemia No date: Fibroid No date: History of chlamydia No date: History of gonorrhea No date: History of prior pregnancy with IUGR newborn No date: History of pyelonephritis No date: History of spontaneous abortion     Comment:  x2  Past Surgical History: No date: denies surgical history  BMI    Body Mass Index: 20.12 kg/m      Reproductive/Obstetrics negative OB ROS                             Anesthesia Physical Anesthesia Plan  ASA: 2 and emergent  Anesthesia Plan: General ETT   Post-op Pain Management: Toradol IV (intra-op)* and Ofirmev IV (intra-op)*   Induction: Intravenous and Rapid sequence  PONV Risk Score and Plan: 4 or greater and Ondansetron, Dexamethasone, Midazolam and Propofol infusion  Airway Management Planned: Oral ETT  Additional Equipment:   Intra-op Plan:   Post-operative Plan: Extubation in OR  Informed Consent: I have reviewed the patients History and  Physical, chart, labs and discussed the procedure including the risks, benefits and alternatives for the proposed anesthesia with the patient or authorized representative who has indicated his/her understanding and acceptance.     Dental Advisory Given  Plan Discussed with: CRNA and Surgeon  Anesthesia Plan Comments:         Anesthesia Quick Evaluation

## 2023-03-23 NOTE — Progress Notes (Signed)
During assessment patient stated she had to void. Asked patient if she was okay to get up and she wasn't sure. RN stated we could use a BSC. Patient agreed but when RN got back with [facility] patient asked for bed pan. Patient not wanting to get out of bed yet.

## 2023-03-23 NOTE — Anesthesia Procedure Notes (Signed)
Procedure Name: Intubation Date/Time: 03/23/2023 1:25 AM  Performed by: Stormy Fabian, CRNAPre-anesthesia Checklist: Patient identified, Emergency Drugs available, Suction available and Patient being monitored Patient Re-evaluated:Patient Re-evaluated prior to induction Oxygen Delivery Method: Circle system utilized Preoxygenation: Pre-oxygenation with 100% oxygen Induction Type: IV induction, Cricoid Pressure applied and Rapid sequence Laryngoscope Size: Mac and 3 Grade View: Grade II Tube type: Oral Tube size: 6.5 mm Number of attempts: 1 Airway Equipment and Method: Stylet Placement Confirmation: ETT inserted through vocal cords under direct vision, positive ETCO2 and breath sounds checked- equal and bilateral Secured at: 20 cm Tube secured with: Tape Dental Injury: Teeth and Oropharynx as per pre-operative assessment

## 2023-03-23 NOTE — Discharge Summary (Signed)
DC Summary Discharge Summary   Patient ID: SILAS SEDAM [id number] 22 y.o. 2001/01/15  Admit date: 03/22/2023  Discharge date: 03/23/2023  Principal Diagnoses:  Ruptured left tubal ectopic pregnancy  Secondary Diagnoses:  none  Procedures performed during the hospitalization:  Robot assisted laparoscopic left salpingectomy, evacuation of hemoperitoneum, left ovarian cystectomy  HPI: 23 y.o. [id number] female who presents with acutely worsening abdominal pain, mostly on left side. She had a period about 2 weeks ago. She states that her periods are regular and this period was normal and lasted 4-5 days. She had some spotting with the pain. She had worsening pain until today when her pain became unbearable. She notes she has tried using heating pads and Tylenol. Her pain is mostly on her left, but also in her back. The heating pads ease her pain somewhat. Nothing seems to make her pain worse. She rates the pain as 10/10. She notes associated nausea without emesis. She has a hard time describing the pain and apologizes for not being able to concentrate enough to describe the pain.   Past Medical History:  Diagnosis Date   Anemia    Fibroid    History of chlamydia    History of gonorrhea    History of prior pregnancy with IUGR newborn    History of pyelonephritis    History of spontaneous abortion    x2    Past Surgical History:  Procedure Laterality Date   denies surgical history      Allergies  Allergen Reactions   Penicillins Rash    Has patient had a PCN reaction causing immediate rash, facial/tongue/throat swelling, SOB or lightheadedness with hypotension: Yes Has patient had a PCN reaction causing severe rash involving mucus membranes or skin necrosis: No Has patient had a PCN reaction that required hospitalization No Has patient had a PCN reaction occurring within the last 10 years: No If all of the above answers are "NO", then may proceed with Cephalosporin use.      Social History   Tobacco Use   Smoking status: Never    Passive exposure: Current   Smokeless tobacco: Never   Tobacco comments:    Exposed to secondhand cigarette smoke (mother smokes).  Vaping Use   Vaping status: Never Used  Substance Use Topics   Alcohol use: Not Currently    Comment: Last ETOH use "years ago".   Drug use: Not Currently    Types: Marijuana    Comment: Last marijuana use was in 2021.    Family History  Problem Relation Age of Onset   Hypertension Paternal Grandfather    Hypertension Paternal Grandmother    Hypertension Maternal Grandmother    Hypertension Father     Hospital Course:  Admitted for the above surgery, which occurred without incident. For pain management she was observed.  Several hours after surgery she was meeting discharge criteria: adequate pain control, ambulating, voiding, tolerating PO.  Her vital signs were stable. She was deemed stable for discharge.   Discharge Exam: BP 110/73 (BP Location: Left Arm)   Pulse 84   Temp 98.7 F (37.1 C) (Oral)   Resp 17   Ht 5\' 2"  (1.575 m)   Wt 49.9 kg   LMP 03/10/2023 (Exact Date)   SpO2 97%   BMI 20.12 kg/m  Physical Exam Constitutional:      General: She is not in acute distress.    Appearance: Normal appearance. She is well-developed.  HENT:     Head: Normocephalic and atraumatic.  Eyes:     General: No scleral icterus.    Conjunctiva/sclera: Conjunctivae normal.  Cardiovascular:     Rate and Rhythm: Normal rate and regular rhythm.     Heart sounds: No murmur heard.    No friction rub. No gallop.  Pulmonary:     Effort: Pulmonary effort is normal. No respiratory distress.     Breath sounds: Normal breath sounds. No wheezing or rales.  Abdominal:     General: Bowel sounds are normal. There is no distension.     Palpations: Abdomen is soft. There is no mass.     Tenderness: There is no abdominal tenderness. There is no guarding or rebound.     Comments: Incisions: without  erythema, induration, warmth, and tenderness. They are clean, dry, and intact.     Musculoskeletal:        General: Normal range of motion.     Cervical back: Normal range of motion and neck supple.  Neurological:     General: No focal deficit present.     Mental Status: She is alert and oriented to person, place, and time.     Cranial Nerves: No cranial nerve deficit.  Skin:    General: Skin is warm and dry.     Findings: No erythema.  Psychiatric:        Mood and Affect: Mood normal.        Behavior: Behavior normal.        Judgment: Judgment normal.      Condition at Discharge: Stable  Complications affecting treatment: None  Discharge Medications:  Allergies as of 03/23/2023       Reactions   Penicillins Rash   Has patient had a PCN reaction causing immediate rash, facial/tongue/throat swelling, SOB or lightheadedness with hypotension: Yes Has patient had a PCN reaction causing severe rash involving mucus membranes or skin necrosis: No Has patient had a PCN reaction that required hospitalization No Has patient had a PCN reaction occurring within the last 10 years: No If all of the above answers are "NO", then may proceed with Cephalosporin use.        Medication List     TAKE these medications    HYDROcodone-acetaminophen 5-325 MG tablet Commonly known as: NORCO/VICODIN Take 1 tablet by mouth every 6 (six) hours as needed (breakthrough pain).   ibuprofen 600 MG tablet Commonly known as: ADVIL Take 1 tablet (600 mg total) by mouth every 6 (six) hours.         Follow-up arrangements:   Follow-up Information     Conard Novak, MD. Schedule an appointment as soon as possible for a visit in 2 week(s).   Specialty: Obstetrics and Gynecology Why: Post-op follow up Contact information: [address] [place] [address] [phone]                  Discharge Disposition: Discharge disposition: 01-Home or Self Care     Signed: Thomasene Mohair, MD  03/23/2023 10:21 AM

## 2023-03-23 NOTE — Transfer of Care (Signed)
Immediate Anesthesia Transfer of Care Note  Patient: Maribell D Prien  Procedure(s) Performed: XI ROBOTIC ASSISTED SALPINGECTOMY (Left: Abdomen) XI ROBOTIC ASSISTED DIAGNOSTIC LAPAROSCOPY with removal of ectopic (Abdomen) XI ROBOTIC ASSISTED LAPAROSCOPIC OVARIAN CYSTECTOMY (Left: Abdomen)  Patient Location: PACU  Anesthesia Type:General  Level of Consciousness: sedated and unresponsive  Airway & Oxygen Therapy: Patient connected to nasal cannula oxygen  Post-op Assessment: Report given to RN and Post -op Vital signs reviewed and stable  Post vital signs: Reviewed and stable  Last Vitals:  Vitals Value Taken Time  BP 103/58 03/23/23 0320  Temp    Pulse 80 03/23/23 0325  Resp 14 03/23/23 0325  SpO2 100 % 03/23/23 0325  Vitals shown include unfiled device data.  Last Pain:  Vitals:   03/23/23 0016  TempSrc:   PainSc: 9          Complications: No notable events documented.

## 2023-03-26 ENCOUNTER — Encounter: Payer: Self-pay | Admitting: Obstetrics and Gynecology

## 2023-03-26 LAB — SURGICAL PATHOLOGY

## 2023-03-31 NOTE — Anesthesia Postprocedure Evaluation (Signed)
 Anesthesia Post Note  Patient: Betty Allen  Procedure(s) Performed: XI ROBOTIC ASSISTED SALPINGECTOMY (Left: Abdomen) XI ROBOTIC ASSISTED DIAGNOSTIC LAPAROSCOPY with removal of ectopic (Abdomen) XI ROBOTIC ASSISTED LAPAROSCOPIC OVARIAN CYSTECTOMY (Left: Abdomen)  Patient location during evaluation: PACU Anesthesia Type: General Level of consciousness: awake and alert Pain management: pain level controlled Vital Signs Assessment: post-procedure vital signs reviewed and stable Respiratory status: spontaneous breathing, nonlabored ventilation and respiratory function stable Cardiovascular status: blood pressure returned to baseline and stable Postop Assessment: no apparent nausea or vomiting Anesthetic complications: no   No notable events documented.   Last Vitals:  Vitals:   03/23/23 0739 03/23/23 1153  BP: 110/73 108/71  Pulse: 84 74  Resp: 17 17  Temp: 37.1 C 36.8 C  SpO2: 97%     Last Pain:  Vitals:   03/23/23 1153  TempSrc: Oral  PainSc:                  Foye Deer

## 2023-04-24 ENCOUNTER — Other Ambulatory Visit: Payer: Self-pay

## 2023-04-24 ENCOUNTER — Emergency Department
Admission: EM | Admit: 2023-04-24 | Discharge: 2023-04-24 | Disposition: A | Discharge status: Home / Self Care | Attending: Emergency Medicine | Admitting: Emergency Medicine

## 2023-04-24 ENCOUNTER — Encounter: Payer: Self-pay | Admitting: Emergency Medicine

## 2023-04-24 DIAGNOSIS — J101 Influenza due to other identified influenza virus with other respiratory manifestations: Secondary | ICD-10-CM | POA: Diagnosis not present

## 2023-04-24 DIAGNOSIS — R059 Cough, unspecified: Secondary | ICD-10-CM | POA: Diagnosis present

## 2023-04-24 LAB — GROUP A STREP BY PCR: Group A Strep by PCR: NOT DETECTED

## 2023-04-24 LAB — RESP PANEL BY RT-PCR (RSV, FLU A&B, COVID)  RVPGX2
Influenza A by PCR: NEGATIVE
Influenza B by PCR: POSITIVE — AB
Resp Syncytial Virus by PCR: NEGATIVE
SARS Coronavirus 2 by RT PCR: NEGATIVE

## 2023-04-24 MED ORDER — ACETAMINOPHEN 325 MG PO TABS
650.0000 mg | ORAL_TABLET | Freq: Once | ORAL | Status: AC | PRN
  Administered 2023-04-24: 650 mg via ORAL
  Filled 2023-04-24: qty 2

## 2023-04-24 NOTE — Discharge Instructions (Signed)
 Follow-up with your primary care provider or [place] urgent care if any continued problems.  Remain on clear liquids today which will decrease the amount of diarrhea but increased urination.  Once the diarrhea has improved stay with bananas, rice, applesauce and plain toast until you begin feeling better.  Tylenol or ibuprofen as needed for fever, body aches or headache.  At this time you are considered contagious.

## 2023-04-24 NOTE — ED Provider Notes (Signed)
 [facility] Provider Note    Event Date/Time   First MD Initiated Contact with Patient 04/24/23 [phone]     (approximate)   History   Nasal Congestion   HPI  Betty Allen is a 23 y.o. female   is brought to the ED with complaint of cough, congestion, sore throat, fever and bodyaches for the last 2 days.  Patient was given ibuprofen at 4 AM this morning.  No known sick ax.      Physical Exam   Triage Vital Signs: ED Triage Vitals  Encounter Vitals Group     BP 04/24/23 0835 113/75     Systolic BP Percentile --      Diastolic BP Percentile --      Pulse Rate 04/24/23 0835 (!) 138     Resp 04/24/23 0835 17     Temp 04/24/23 0835 (!) 100.4 F (38 C)     Temp Source 04/24/23 0835 Oral     SpO2 04/24/23 0835 97 %     Weight 04/24/23 0839 115 lb (52.2 kg)     Height 04/24/23 0839 5\' 2"  (1.575 m)     Head Circumference --      Peak Flow --      Pain Score 04/24/23 0839 7     Pain Loc --      Pain Education --      Exclude from Growth Chart --     Most recent vital signs: Vitals:   04/24/23 0835  BP: 113/75  Pulse: (!) 138  Resp: 17  Temp: (!) 100.4 F (38 C)  SpO2: 97%     General: Awake, no distress.  CV:  Good peripheral perfusion.  Heart regular rate and rhythm. Resp:  Normal effort.  Lungs are clear bilaterally. Abd:  No distention.  Other:     ED Results / Procedures / Treatments   Labs (all labs ordered are listed, but only abnormal results are displayed) Labs Reviewed  RESP PANEL BY RT-PCR (RSV, FLU A&B, COVID)  RVPGX2 - Abnormal; Notable for the following components:      Result Value   Influenza B by PCR POSITIVE (*)    All other components within normal limits  GROUP A STREP BY PCR      PROCEDURES:  Critical Care performed:   Procedures   MEDICATIONS ORDERED IN ED: Medications  acetaminophen (TYLENOL) tablet 650 mg (650 mg Oral Given 04/24/23 0843)     IMPRESSION / MDM / ASSESSMENT AND PLAN / ED  COURSE  I reviewed the triage vital signs and the nursing notes.   Differential diagnosis includes, but is not limited to, COVID, influenza, RSV, strep pharyngitis, viral illness.  23 year old female presents to the ED with complaint of cough, congestion, sore throat and possible fever.  Strep test was negative patient was made aware that she was positive for influenza B.  She then states that her child was sick 2 weeks ago with the same.  She was strongly encouraged to drink fluids to stay hydrated.  Continue with Tylenol and ibuprofen as needed for symptoms and over-the-counter cough medication if needed.  She has to follow-up with her PCP or urgent care if any continued problems.     Patient's presentation is most consistent with acute complicated illness / injury requiring diagnostic workup.  FINAL CLINICAL IMPRESSION(S) / ED DIAGNOSES   Final diagnoses:  Influenza B     Rx / DC Orders   ED Discharge Orders  None        Note:  This document was prepared using Dragon voice recognition software and may include unintentional dictation errors.   Tommi Rumps, PA-C 04/24/23 1436    Trinna Post, MD 04/24/23 [phone]

## 2023-04-24 NOTE — ED Notes (Signed)
 See triage note: pt ambulatory to room with steady gait. Pt medicated for temp of 100.4 in triage and swabbed. Pt reports cough, congestion, sore throat and chills x2 days.

## 2023-04-24 NOTE — ED Triage Notes (Signed)
 Patient to ED via POV for congestion, cough, sore throat, fever and body aches. Ongoing x2 days. Took ibuprofen- 4am.

## 2023-09-05 ENCOUNTER — Emergency Department
Admission: EM | Admit: 2023-09-05 | Discharge: 2023-09-05 | Disposition: A | Discharge status: Home / Self Care | Attending: Emergency Medicine | Admitting: Emergency Medicine

## 2023-09-05 ENCOUNTER — Other Ambulatory Visit: Payer: Self-pay

## 2023-09-05 ENCOUNTER — Emergency Department

## 2023-09-05 DIAGNOSIS — R0789 Other chest pain: Secondary | ICD-10-CM | POA: Insufficient documentation

## 2023-09-05 DIAGNOSIS — F419 Anxiety disorder, unspecified: Secondary | ICD-10-CM | POA: Diagnosis not present

## 2023-09-05 DIAGNOSIS — R079 Chest pain, unspecified: Secondary | ICD-10-CM

## 2023-09-05 DIAGNOSIS — R0602 Shortness of breath: Secondary | ICD-10-CM | POA: Insufficient documentation

## 2023-09-05 LAB — BASIC METABOLIC PANEL WITH GFR
Anion gap: 7 (ref 5–15)
BUN: 21 mg/dL — ABNORMAL HIGH (ref 6–20)
CO2: 23 mmol/L (ref 22–32)
Calcium: 8.7 mg/dL — ABNORMAL LOW (ref 8.9–10.3)
Chloride: 108 mmol/L (ref 98–111)
Creatinine, Ser: 0.64 mg/dL (ref 0.44–1.00)
GFR, Estimated: >60 mL/min (ref >60)
Glucose, Bld: 81 mg/dL (ref 70–99)
Potassium: 3.8 mmol/L (ref 3.5–5.1)
Sodium: 138 mmol/L (ref 135–145)

## 2023-09-05 LAB — CBC
HCT: 36.8 % (ref 36.0–46.0)
Hemoglobin: 11.9 g/dL — ABNORMAL LOW (ref 12.0–15.0)
MCH: 27.9 pg (ref 26.0–34.0)
MCHC: 32.3 g/dL (ref 30.0–36.0)
MCV: 86.4 fL (ref 80.0–100.0)
Platelets: 198 K/uL (ref 150–400)
RBC: 4.26 MIL/uL (ref 3.87–5.11)
RDW: 13.9 % (ref 11.5–15.5)
WBC: 8.3 K/uL (ref 4.0–10.5)
nRBC: 0 % (ref 0.0–0.2)

## 2023-09-05 LAB — POC URINE PREG, ED: Preg Test, Ur: NEGATIVE

## 2023-09-05 LAB — TROPONIN I (HIGH SENSITIVITY): Troponin I (High Sensitivity): <2 ng/L (ref <18)

## 2023-09-05 NOTE — Discharge Instructions (Signed)
 Your exam, labs, EKG, and chest CXR are normal and reassuring.

## 2023-09-05 NOTE — ED Provider Notes (Signed)
 [facility] Emergency Department Provider Note     Event Date/Time   First MD Initiated Contact with Patient 09/05/23 1909     (approximate)   History   Chest Pain and Anxiety   HPI  Betty Allen is a 23 y.o. female with a history of anemia, presents to the ED for evaluation of some chest tightness and shortness of breath.  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  She reports she felt extremely anxious during the incident.  She denies any cough, congestion, or syncope.  No headache, paralysis, weakness reported.  Patient denies any significant plaints at the time of his evaluation.  Physical Exam   Triage Vital Signs: ED Triage Vitals  Encounter Vitals Group     BP 09/05/23 1738 117/78     Girls Systolic BP Percentile --      Girls Diastolic BP Percentile --      Boys Systolic BP Percentile --      Boys Diastolic BP Percentile --      Pulse Rate 09/05/23 1738 78     Resp 09/05/23 1738 20     Temp 09/05/23 1738 99 F (37.2 C)     Temp Source 09/05/23 1738 Oral     SpO2 09/05/23 1738 100 %     Weight 09/05/23 1739 115 lb (52.2 kg)     Height 09/05/23 1739 5' 2 (1.575 m)     Head Circumference --      Peak Flow --      Pain Score 09/05/23 1739 6     Pain Loc --      Pain Education --      Exclude from Growth Chart --     Most recent vital signs: Vitals:   09/05/23 1738  BP: 117/78  Pulse: 78  Resp: 20  Temp: 99 F (37.2 C)  SpO2: 100%    General Awake, no distress. NAD HEENT NCAT. PERRL. EOMI. No rhinorrhea. Mucous membranes are moist.  CV:  Good peripheral perfusion. RRR RESP:  Normal effort. CTA ABD:  No distention.  MSK:  AROM of all extremities   ED Results / Procedures / Treatments   Labs (all labs ordered are listed, but only abnormal results are displayed) Labs Reviewed  BASIC METABOLIC PANEL WITH GFR - Abnormal; Notable for the following components:       Result Value   BUN 21 (*)    Calcium  8.7 (*)    All other components within normal limits  CBC - Abnormal; Notable for the following components:   Hemoglobin 11.9 (*)    All other components within normal limits  POC URINE PREG, ED  TROPONIN I (HIGH SENSITIVITY)     EKG  Vent. rate 71 BPM PR interval 186 ms QRS duration 70 ms QT/QTcB 380/412 ms P-R-T axes 67 89 82 Normal sinus rhythm Normal ECG No previous ECGs available  RADIOLOGY  I personally viewed and evaluated these images as part of my medical decision making, as well as reviewing the written report by the radiologist.  ED Provider Interpretation: No acute findings  DG Chest 2 View Result Date: 09/05/2023 CLINICAL DATA:  Chest pain and tightness, short of breath EXAM: CHEST - 2 VIEW COMPARISON:  None Available. FINDINGS: Frontal and lateral views of the chest demonstrate an unremarkable cardiac silhouette. No acute airspace disease, effusion, or pneumothorax. No acute bony abnormalities. IMPRESSION: 1. No acute intrathoracic process. Electronically  Signed   By: Ozell Daring M.D.   On: 09/05/2023 18:41     PROCEDURES:  Critical Care performed: No  Procedures   MEDICATIONS ORDERED IN ED: Medications - No data to display   IMPRESSION / MDM / ASSESSMENT AND PLAN / ED COURSE  I reviewed the triage vital signs and the nursing notes.                              Differential diagnosis includes, but is not limited to,  ACS, aortic dissection, pulmonary embolism, cardiac tamponade, pneumothorax, pneumonia, pericarditis, myocarditis, GI-related causes including esophagitis/gastritis, and musculoskeletal chest wall pain.     Patient's presentation is most consistent with acute complicated illness / injury requiring diagnostic workup.  Patient's diagnosis is consistent with nonspecific chest pain and shortness of breath, likely related to stress response.  Patient may have also experienced some mild episode of  hypoglycemia.  Her exam at this time is overall reassuring.  Low concern for ACS as patient symptoms have resolved without any persistent complaints.  Patient's vital signs are reassuring without evidence of tachycardia, tachypnea, or hypoxia.  Labs are flat and unremarkable overall with the negative troponin.  Patient with a EKG without evidence of malignant arrhythmia.  Chest x-ray interpreted by me, shows no acute intrathoracic process.  Patient will be discharged home with directions to take her home meds, rest, and hydrate as appropriate. Patient is to follow up with her PCP as suggested, as needed or otherwise directed. Patient is given ED precautions to return to the ED for any worsening or new symptoms.   FINAL CLINICAL IMPRESSION(S) / ED DIAGNOSES   Final diagnoses:  Nonspecific chest pain     Rx / DC Orders   ED Discharge Orders     None        Note:  This document was prepared using Dragon voice recognition software and may include unintentional dictation errors.    Loyd Candida LULLA Aldona, PA-C 09/05/23 1943    Dorothyann Drivers, MD 09/06/23 [phone]

## 2023-09-05 NOTE — ED Triage Notes (Signed)
 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 Pt states she felt extremely anxious. Pt states she was settling a screaming resident when it happened.

## 2024-01-10 ENCOUNTER — Other Ambulatory Visit: Payer: Self-pay

## 2024-01-10 ENCOUNTER — Emergency Department

## 2024-01-10 ENCOUNTER — Emergency Department
Admission: EM | Admit: 2024-01-10 | Discharge: 2024-01-11 | Disposition: A | Discharge status: Home / Self Care | Attending: Emergency Medicine | Admitting: Emergency Medicine

## 2024-01-10 DIAGNOSIS — X58XXXA Exposure to other specified factors, initial encounter: Secondary | ICD-10-CM | POA: Insufficient documentation

## 2024-01-10 DIAGNOSIS — O9A211 Injury, poisoning and certain other consequences of external causes complicating pregnancy, first trimester: Secondary | ICD-10-CM | POA: Insufficient documentation

## 2024-01-10 DIAGNOSIS — S39012A Strain of muscle, fascia and tendon of lower back, initial encounter: Secondary | ICD-10-CM | POA: Insufficient documentation

## 2024-01-10 DIAGNOSIS — Z3A01 Less than 8 weeks gestation of pregnancy: Secondary | ICD-10-CM | POA: Insufficient documentation

## 2024-01-10 LAB — URINALYSIS, ROUTINE W REFLEX MICROSCOPIC
Bacteria, UA: NONE SEEN
Bilirubin Urine: NEGATIVE
Glucose, UA: NEGATIVE mg/dL
Hgb urine dipstick: NEGATIVE
Ketones, ur: 20 mg/dL — AB
Nitrite: NEGATIVE
Protein, ur: NEGATIVE mg/dL
Specific Gravity, Urine: 1.028 (ref 1.005–1.030)
pH: 5 (ref 5.0–8.0)

## 2024-01-10 LAB — CBC
HCT: 38.8 % (ref 36.0–46.0)
Hemoglobin: 12.9 g/dL (ref 12.0–15.0)
MCH: 28.4 pg (ref 26.0–34.0)
MCHC: 33.2 g/dL (ref 30.0–36.0)
MCV: 85.5 fL (ref 80.0–100.0)
Platelets: 211 K/uL (ref 150–400)
RBC: 4.54 MIL/uL (ref 3.87–5.11)
RDW: 14.7 % (ref 11.5–15.5)
WBC: 7 K/uL (ref 4.0–10.5)
nRBC: 0 % (ref 0.0–0.2)

## 2024-01-10 LAB — BASIC METABOLIC PANEL WITH GFR
Anion gap: 14 (ref 5–15)
BUN: 12 mg/dL (ref 6–20)
CO2: 21 mmol/L — ABNORMAL LOW (ref 22–32)
Calcium: 8.8 mg/dL — ABNORMAL LOW (ref 8.9–10.3)
Chloride: 99 mmol/L (ref 98–111)
Creatinine, Ser: 0.64 mg/dL (ref 0.44–1.00)
GFR, Estimated: >60 mL/min (ref >60)
Glucose, Bld: 87 mg/dL (ref 70–99)
Potassium: 3.5 mmol/L (ref 3.5–5.1)
Sodium: 134 mmol/L — ABNORMAL LOW (ref 135–145)

## 2024-01-10 LAB — POC URINE PREG, ED: Preg Test, Ur: POSITIVE — AB

## 2024-01-10 LAB — HCG, QUANTITATIVE, PREGNANCY: hCG, Beta Chain, Quant, S: 4536 m[IU]/mL — ABNORMAL HIGH (ref <5)

## 2024-01-10 MED ORDER — LIDOCAINE 5 % EX PTCH
2.0000 | MEDICATED_PATCH | CUTANEOUS | Status: DC
  Administered 2024-01-10: 2 via TRANSDERMAL
  Filled 2024-01-10: qty 2

## 2024-01-10 MED ORDER — METHOCARBAMOL 500 MG PO TABS
500.0000 mg | ORAL_TABLET | Freq: Once | ORAL | Status: AC
  Administered 2024-01-10: 500 mg via ORAL
  Filled 2024-01-10: qty 1

## 2024-01-10 MED ORDER — ACETAMINOPHEN 500 MG PO TABS
1000.0000 mg | ORAL_TABLET | Freq: Once | ORAL | Status: AC
  Administered 2024-01-10: 1000 mg via ORAL
  Filled 2024-01-10: qty 2

## 2024-01-10 NOTE — Discharge Instructions (Addendum)
 Use Tylenol  for pain and fevers.  Up to 1000 mg per dose, up to 4 times per day.  Do not take more than 4000 mg of Tylenol /acetaminophen  within 24 hours..  Please use lidocaine  patches at your site of pain.  Apply 1 patch at a time, leave on for 12 hours, then remove for 12 hours.  12 hours on, 12 hours off.  Do not apply more than 1 patch at a time.  About [redacted] weeks pregnant based off of ultrasound tonight.  Do NOT take NSAIDs for your back - stuff like ibuprofen , Advil , BC/Goodies, etc.

## 2024-01-10 NOTE — ED Provider Notes (Signed)
 [facility] Provider Note    Event Date/Time   First MD Initiated Contact with Patient 01/10/24 2301     (approximate)   History   Back Pain   HPI  Betty Allen is a 23 y.o. female who presents to the ED for evaluation of Back Pain   Patient presents for evaluation of about 3 days of atraumatic right sided lumbar pain.  Not yet taking any medications for pain at home, reports the pain is unbearable.  No falls or injuries, no changes to toileting, no saddle anesthesias, no fevers.  Did not know that she was pregnant.  G5 at about 5 weeks by LMP  I review OB/GYN surgical information from February of this year where she was taken to the OR for ectopic pregnancy.  Left salpingectomy   Physical Exam   Triage Vital Signs: ED Triage Vitals  Encounter Vitals Group     BP 01/10/24 1828 110/74     Girls Systolic BP Percentile --      Girls Diastolic BP Percentile --      Boys Systolic BP Percentile --      Boys Diastolic BP Percentile --      Pulse Rate 01/10/24 1828 (!) 103     Resp 01/10/24 1828 18     Temp 01/10/24 1828 98.9 F (37.2 C)     Temp Source 01/10/24 1828 Oral     SpO2 01/10/24 1828 100 %     Weight 01/10/24 1831 110 lb (49.9 kg)     Height 01/10/24 1831 5' 2 (1.575 m)     Head Circumference --      Peak Flow --      Pain Score 01/10/24 1829 9     Pain Loc --      Pain Education --      Exclude from Growth Chart --     Most recent vital signs: Vitals:   01/10/24 1828 01/10/24 2346  BP: 110/74 110/80  Pulse: (!) 103 (!) 104  Resp: 18 15  Temp: 98.9 F (37.2 C) 98.7 F (37.1 C)  SpO2: 100% 99%    General: Awake, no distress.  Laying on her left side, seems mildly uncomfortable but generally well-appearing CV:  Good peripheral perfusion.  Resp:  Normal effort.  Abd:  No distention.  Soft and nontender MSK:  No deformity noted.  Mild right-sided paraspinal lumbar tenderness without overlying skin changes or signs of  trauma Neuro:  No focal deficits appreciated. Other:     ED Results / Procedures / Treatments   Labs (all labs ordered are listed, but only abnormal results are displayed) Labs Reviewed  BASIC METABOLIC PANEL WITH GFR - Abnormal; Notable for the following components:      Result Value   Sodium 134 (*)    CO2 21 (*)    Calcium  8.8 (*)    All other components within normal limits  HCG, QUANTITATIVE, PREGNANCY - Abnormal; Notable for the following components:   hCG, Beta Chain, Quant, S 4,536 (*)    All other components within normal limits  URINALYSIS, ROUTINE W REFLEX MICROSCOPIC - Abnormal; Notable for the following components:   Color, Urine YELLOW (*)    APPearance CLOUDY (*)    Ketones, ur 20 (*)    Leukocytes,Ua LARGE (*)    All other components within normal limits  POC URINE PREG, ED - Abnormal; Notable for the following components:   Preg Test, Ur POSITIVE (*)  All other components within normal limits  CBC    EKG   RADIOLOGY Obstetric ultrasound interpreted by me with IUP  Official radiology report(s): US  OB LESS THAN 14 WEEKS WITH OB TRANSVAGINAL Result Date: 01/10/2024 EXAM: ULTRASOUND FIRST TRIMESTER TECHNIQUE: Transabdominal and Transvaginal first trimester obstetric pelvic duplex ultrasound was performed with real-time imaging, color flow Doppler imaging, and spectral analysis. COMPARISON: None available. CLINICAL HISTORY: Pain. FINDINGS: UTERUS: No focal myometrial mass. GESTATIONAL SAC(S): Single normal appearing gestational sac measuring 6.3 mm, corresponding to an estimated gestational age of [redacted] weeks 2 days. No subchorionic hemorrhage. YOLK SAC: Not identified. EMBRYO(<11WK) /FETUS(>=11WK): Not identified. CROWN RUMP LENGTH: Not measured. RATE OF CARDIAC ACTIVITY: Not measured. RIGHT OVARY: Unremarkable. Normal arterial and venous flow. LEFT OVARY: Unremarkable. Normal arterial and venous flow. FREE FLUID: No free fluid. MEASUREMENTS ESTIMATED GESTATIONAL  AGE BY CURRENT ULTRASOUND: 5 weeks 2 days ESTIMATED GESTATIONAL AGE BY LMP/PRIOR ULTRASOUND: 5 weeks 2 days ESTIMATED DUE DATE: 09/09/2024 IMPRESSION: 1. Intrauterine gestational sac without yolk sac or fetal pole, most consistent with early intrauterine pregnancy at approximately 5 weeks 2 days; this could be followed with repeat ultrasound in 10 to 14 days to ensure expected progression. 2. No subchorionic hemorrhage. Electronically signed by: Franky Crease MD 01/10/2024 09:03 PM EST RP Workstation: [id number]    PROCEDURES and INTERVENTIONS:  Procedures  Medications  lidocaine  (LIDODERM ) 5 % 2 patch (2 patches Transdermal Patch Applied 01/10/24 2347)  acetaminophen  (TYLENOL ) tablet 1,000 mg (1,000 mg Oral Given 01/10/24 2349)  methocarbamol (ROBAXIN) tablet 500 mg (500 mg Oral Given 01/10/24 2349)     IMPRESSION / MDM / ASSESSMENT AND PLAN / ED COURSE  I reviewed the triage vital signs and the nursing notes.  Differential diagnosis includes, but is not limited to, lumbar strain, fracture, cauda equina, ectopic pregnancy  {Patient presents with symptoms of an acute illness or injury that is potentially life-threatening.  Young woman presents with a few days of atraumatic low back pain without red flag features.  Found to have positive pregnancy test, ultrasound with early intrauterine gestational sac.  Less likely ectopic pregnancy, benign anterior abdomen as well.  No red flag features or indications for lumbar imaging.  No evidence of neurologic deficits.  Normal CBC, metabolic panel.  Urine with ketones and clouded by squamous cells, she has no urinary symptoms, doubt UTI.  No bacteria.  Will provide nonnarcotic multimodal analgesia and discharged with the same.  Discussed OB/GYN follow-up and ED return precautions.      FINAL CLINICAL IMPRESSION(S) / ED DIAGNOSES   Final diagnoses:  Less than [redacted] weeks gestation of pregnancy  Strain of lumbar region, initial encounter     Rx /  DC Orders   ED Discharge Orders          Ordered    lidocaine  (LIDODERM ) 5 %  Every 12 hours,   Status:  Discontinued        01/11/24 0005    lidocaine  (LIDODERM ) 5 %  Every 12 hours        01/11/24 0011             Note:  This document was prepared using Dragon voice recognition software and may include unintentional dictation errors.   Claudene Rover, MD 01/11/24 [phone]

## 2024-01-10 NOTE — ED Triage Notes (Signed)
 Pt arrives via POV with c/o lower back pain on the right side x3 days and states that the pain is radiating down to their right butt cheek and around to their RLQ that started today. Pt is unable to sit still because of the pain. Pt is A&Ox4 and ambulatory during triage.

## 2024-01-11 MED ORDER — LIDOCAINE 5 % EX PTCH: 1.0000 | MEDICATED_PATCH | Freq: Two times a day (BID) | CUTANEOUS | 1 refills | Status: DC

## 2024-01-11 MED ORDER — LIDOCAINE 5 % EX PTCH: 1.0000 | MEDICATED_PATCH | Freq: Two times a day (BID) | CUTANEOUS | 1 refills | Status: AC
